# Patient Record
Sex: Male | Born: 1945 | Race: Black or African American | Hispanic: No | Marital: Married | Smoking: Former smoker
Health system: Southern US, Community
[De-identification: ages and names within clinical notes are randomized; demographics above are authoritative.]

## PROBLEM LIST (undated history)

## (undated) DIAGNOSIS — Z87448 Personal history of other diseases of urinary system: Secondary | ICD-10-CM

## (undated) DIAGNOSIS — K219 Gastro-esophageal reflux disease without esophagitis: Secondary | ICD-10-CM

## (undated) DIAGNOSIS — R569 Unspecified convulsions: Secondary | ICD-10-CM

## (undated) DIAGNOSIS — N201 Calculus of ureter: Secondary | ICD-10-CM

## (undated) DIAGNOSIS — S479XXA Crushing injury of shoulder and upper arm, unspecified arm, initial encounter: Secondary | ICD-10-CM

## (undated) DIAGNOSIS — E119 Type 2 diabetes mellitus without complications: Secondary | ICD-10-CM

## (undated) DIAGNOSIS — Z8669 Personal history of other diseases of the nervous system and sense organs: Secondary | ICD-10-CM

## (undated) DIAGNOSIS — T7840XA Allergy, unspecified, initial encounter: Secondary | ICD-10-CM

## (undated) DIAGNOSIS — N135 Crossing vessel and stricture of ureter without hydronephrosis: Secondary | ICD-10-CM

## (undated) DIAGNOSIS — J45909 Unspecified asthma, uncomplicated: Secondary | ICD-10-CM

## (undated) DIAGNOSIS — I10 Essential (primary) hypertension: Secondary | ICD-10-CM

## (undated) DIAGNOSIS — M1990 Unspecified osteoarthritis, unspecified site: Secondary | ICD-10-CM

## (undated) DIAGNOSIS — E785 Hyperlipidemia, unspecified: Secondary | ICD-10-CM

## (undated) DIAGNOSIS — H269 Unspecified cataract: Secondary | ICD-10-CM

## (undated) HISTORY — DX: Crushing injury of shoulder and upper arm, unspecified arm, initial encounter: S47.9XXA

## (undated) HISTORY — DX: Unspecified convulsions: R56.9

## (undated) HISTORY — DX: Unspecified asthma, uncomplicated: J45.909

## (undated) HISTORY — DX: Unspecified osteoarthritis, unspecified site: M19.90

## (undated) HISTORY — DX: Unspecified cataract: H26.9

## (undated) HISTORY — DX: Hyperlipidemia, unspecified: E78.5

## (undated) HISTORY — PX: EYE SURGERY: SHX253

## (undated) HISTORY — DX: Allergy, unspecified, initial encounter: T78.40XA

## (undated) HISTORY — DX: Essential (primary) hypertension: I10

---

## 1999-10-08 ENCOUNTER — Ambulatory Visit (HOSPITAL_COMMUNITY): Admission: RE | Admit: 1999-10-08 | Discharge: 1999-10-08 | Payer: Self-pay | Admitting: *Deleted

## 1999-10-08 ENCOUNTER — Encounter: Payer: Self-pay | Admitting: *Deleted

## 1999-12-17 ENCOUNTER — Ambulatory Visit (HOSPITAL_COMMUNITY): Admission: RE | Admit: 1999-12-17 | Discharge: 1999-12-17 | Payer: Self-pay | Admitting: Gastroenterology

## 2005-12-23 ENCOUNTER — Encounter: Admission: RE | Admit: 2005-12-23 | Discharge: 2005-12-23 | Payer: Self-pay | Admitting: Sports Medicine

## 2007-07-12 ENCOUNTER — Emergency Department (HOSPITAL_COMMUNITY): Admission: EM | Admit: 2007-07-12 | Discharge: 2007-07-13 | Payer: Self-pay | Admitting: Emergency Medicine

## 2010-05-09 HISTORY — PX: TRANSURETHRAL RESECTION OF PROSTATE: SHX73

## 2010-09-24 NOTE — Op Note (Signed)
[place]  Patient:    Jeremiah Green, Jeremiah Green                     MRN: [id number] Proc. Date: 12/17/99 Adm. Date:  [id number] Disc. Date: [id number] Attending:  Nelda Marseille CC:         Javier Docker, M.D.  Lorelle Formosa, M.D.   Operative Report  PROCEDURE:  Colonoscopy with biopsy.  INDICATIONS:  Patient with bright red blood per rectum probably due to hemorrhoids, a history of colon polyps and a family history of colon polyps and possibly even colon cancer in his mom.  Due for repeat screening.  INFORMED CONSENT:  Consent was signed after risk, benefits, methods and options were thoroughly discussed multiple times in the past.  MEDICINES USED:  Demerol 40 mg, Versed 5 mg.  DESCRIPTION OF PROCEDURE:  Rectal inspection is pertinent for external hemorrhoids.  Digital exam is negative.  Video colonoscope was inserted and easily advanced around the colon to the cecum.  This did not require any abdominal pressure or any position changes.  The cecum was identified by the appendiceal orifice and the ileocecal valve.  The scope was slowly withdrawn. The prep was adequate.  There was minimal liquid stool that required washing and suctioning.  On slow withdrawal through the colon a rare left and right-sided diverticula were seen.  At the proximal level of the sigmoid descending junction a tiny 1-2 mm polyp was seen and was cold biopsied x 2. The scope was further withdrawn back to the rectum.  No other abnormalities were seen.  Once back in the rectum the scope was retroflexed pertinent for some internal hemorrhoids.  Anorectal ______ confirmed the above.  The scope was reinserted a short ways up the left side of the colon.  Air was suctioned, scope removed.  The patient tolerated the procedure well.  There was no obvious immediate complication.  ENDOSCOPIC DIAGNOSES: 1. Internal and external hemorrhoids. 2. Occasional diverticula left  and right. 3. Tiny sigmoid descending polyps status post cold biopsy. 4. Otherwise within normal limits to the cecum.  PLAN: 1. Await pathology and probably recheck colon screening in 5 years or    p.r.n.. 2. Follow up in 4-6 weeks to recheck symptoms and probably, if willing, begin    interferon at that time or least make sure everything is in order to begin    soon. 3. I have asked him and I am writing on discharge sheet when he follows up to    make sure JNR hepatitis C treatment nurse is there that day so we can    make sure everything is done prior to beginning. 4. Call me sooner p.r.n. DD:  12/17/99 TD:  12/18/99 Job: [id number]

## 2011-01-31 LAB — DIFFERENTIAL
Basophils Absolute: 0
Basophils Relative: 1
Eosinophils Absolute: 0.2
Eosinophils Relative: 4
Lymphocytes Relative: 31
Lymphs Abs: 1.8
Monocytes Absolute: 0.6
Monocytes Relative: 10
Neutro Abs: 3.2
Neutrophils Relative %: 55

## 2011-01-31 LAB — I-STAT 8, (EC8 V) (CONVERTED LAB)
Acid-base deficit: 2
BUN: 14
Bicarbonate: 22.1
Chloride: 111
Glucose, Bld: 134 — ABNORMAL HIGH
HCT: 48
Hemoglobin: 16.3
Operator id: 295131
Potassium: 4
Sodium: 140
TCO2: 23
pCO2, Ven: 34.5 — ABNORMAL LOW
pH, Ven: 7.415 — ABNORMAL HIGH

## 2011-01-31 LAB — CBC
HCT: 43.5
Hemoglobin: 14.9
MCHC: 34.2
MCV: 95.5
Platelets: 122 — ABNORMAL LOW
RBC: 4.56
RDW: 13.4
WBC: 5.9

## 2011-01-31 LAB — URINALYSIS, ROUTINE W REFLEX MICROSCOPIC
Bilirubin Urine: NEGATIVE
Glucose, UA: 100 — AB
Hgb urine dipstick: NEGATIVE
Ketones, ur: 15 — AB
Nitrite: NEGATIVE
Protein, ur: NEGATIVE
Specific Gravity, Urine: 1.024
Urobilinogen, UA: 0.2
pH: 5.5

## 2011-01-31 LAB — POCT I-STAT CREATININE
Creatinine, Ser: 1.2
Operator id: 295131

## 2011-01-31 LAB — POCT CARDIAC MARKERS
CKMB, poc: <1 — ABNORMAL LOW
Myoglobin, poc: 162
Operator id: 295131
Troponin i, poc: <0.05

## 2011-05-10 HISTORY — PX: GLAUCOMA SURGERY: SHX656

## 2012-11-22 ENCOUNTER — Ambulatory Visit: Payer: Self-pay

## 2012-11-22 ENCOUNTER — Ambulatory Visit (INDEPENDENT_AMBULATORY_CARE_PROVIDER_SITE_OTHER): Payer: Medicare Other | Admitting: Family Medicine

## 2012-11-22 VITALS — BP 126/86 | HR 70 | Temp 98.2°F | Resp 20 | Ht 68.0 in | Wt 172.0 lb

## 2012-11-22 DIAGNOSIS — M549 Dorsalgia, unspecified: Secondary | ICD-10-CM

## 2012-11-22 DIAGNOSIS — R109 Unspecified abdominal pain: Secondary | ICD-10-CM

## 2012-11-22 DIAGNOSIS — N200 Calculus of kidney: Secondary | ICD-10-CM

## 2012-11-22 DIAGNOSIS — E119 Type 2 diabetes mellitus without complications: Secondary | ICD-10-CM

## 2012-11-22 LAB — POCT UA - MICROSCOPIC ONLY
Bacteria, U Microscopic: NEGATIVE
Casts, Ur, LPF, POC: NEGATIVE
Crystals, Ur, HPF, POC: NEGATIVE
Epithelial cells, urine per micros: NEGATIVE
Mucus, UA: NEGATIVE
Sperm: POSITIVE
Yeast, UA: NEGATIVE

## 2012-11-22 LAB — POCT CBC
Granulocyte percent: 49.5 %G (ref 37–80)
HCT, POC: 47.7 % (ref 43.5–53.7)
Hemoglobin: 15.3 g/dL (ref 14.1–18.1)
Lymph, poc: 3 (ref 0.6–3.4)
MCH, POC: 32.3 pg — AB (ref 27–31.2)
MCHC: 32.1 g/dL (ref 31.8–35.4)
MCV: 100.9 fL — AB (ref 80–97)
MID (cbc): 0.8 (ref 0–0.9)
MPV: 11.6 fL (ref 0–99.8)
POC Granulocyte: 3.7 (ref 2–6.9)
POC LYMPH PERCENT: 39.9 %L (ref 10–50)
POC MID %: 10.6 %M (ref 0–12)
Platelet Count, POC: 152 10*3/uL (ref 142–424)
RBC: 4.73 M/uL (ref 4.69–6.13)
RDW, POC: 13.5 %
WBC: 7.5 10*3/uL (ref 4.6–10.2)

## 2012-11-22 LAB — POCT URINALYSIS DIPSTICK
Bilirubin, UA: NEGATIVE
Glucose, UA: 100
Leukocytes, UA: NEGATIVE
Nitrite, UA: NEGATIVE
Protein, UA: 30
Spec Grav, UA: 1.025
Urobilinogen, UA: 0.2
pH, UA: 6

## 2012-11-22 LAB — GLUCOSE, POCT (MANUAL RESULT ENTRY): POC Glucose: 75 mg/dl (ref 70–99)

## 2012-11-22 MED ORDER — OXYCODONE-ACETAMINOPHEN 5-325 MG PO TABS: 1.0000 | ORAL_TABLET | Freq: Three times a day (TID) | ORAL | Status: DC | PRN

## 2012-11-22 MED ORDER — KETOROLAC TROMETHAMINE 30 MG/ML IJ SOLN
30.0000 mg | Freq: Once | INTRAMUSCULAR | Status: AC
  Administered 2012-11-22: 30 mg via INTRAMUSCULAR

## 2012-11-22 MED ORDER — ONDANSETRON HCL 4 MG PO TABS: 4.0000 mg | ORAL_TABLET | Freq: Three times a day (TID) | ORAL | Status: DC | PRN

## 2012-11-22 MED ORDER — IBUPROFEN 600 MG PO TABS: 600.0000 mg | ORAL_TABLET | Freq: Three times a day (TID) | ORAL | Status: DC | PRN

## 2012-11-22 NOTE — Progress Notes (Signed)
[facility]:  Office Visit  Chief Complaint:  Chief Complaint  Patient presents with  . Back Pain    lower back pain in Lt side x 6 days. -NKI-    HPI: Jeremiah Green is a 67 y.o. male who complains of  1 week history of left sided back pain , sharp and worse with movement. Used ibuprofen with relief.  He has had subjective feves, chills. He deneis n/v. He has some back pain that radiates to stomach and the pain has been moving. He has had no problems urinating. He has had kidney stones before x 1.   Past Medical History  Diagnosis Date  . Asthma   . Diabetes mellitus without complication   . Kidney stone    Past Surgical History  Procedure Laterality Date  . Prostate surgery    . Eye surgery     History   Social History  . Marital Status: Married    Spouse Name: N/A    Number of Children: N/A  . Years of Education: N/A   Social History Main Topics  . Smoking status: Former Games developer  . Smokeless tobacco: None  . Alcohol Use: No  . Drug Use: No  . Sexually Active: Yes   Other Topics Concern  . None   Social History Narrative  . None   Family History  Problem Relation Age of Onset  . Cancer Mother   . Heart disease Father    Allergies  Allergen Reactions  . Sulfa Antibiotics Rash   Prior to Admission medications   Medication Sig Start Date End Date Taking? Authorizing Provider  Fluticasone-Salmeterol (ADVAIR) 500-50 MCG/DOSE AEPB Inhale 1 puff into the lungs every 12 (twelve) hours.   Yes Historical Provider, MD  sitaGLIPtan-metformin (JANUMET) 50-500 MG per tablet Take 1 tablet by mouth 2 (two) times daily with a meal.   Yes Historical Provider, MD     ROS: The patient denies fevers, chills, night sweats, unintentional weight loss, chest pain, palpitations, wheezing, dyspnea on exertion, nausea, vomiting, dysuria, hematuria, melena, numbness, weakness, or tingling.   All other systems have been reviewed and were otherwise negative  with the exception of those mentioned in the HPI and as above.    PHYSICAL EXAM: Filed Vitals:   11/22/12 1700  BP: 126/86  Pulse: 70  Temp: 98.2 F (36.8 C)  Resp: 20   Filed Vitals:   11/22/12 1700  Height: 5\' 8"  (1.727 m)  Weight: 172 lb (78.019 kg)   Body mass index is 26.16 kg/(m^2).  General: Alert, no acute distress HEENT:  Normocephalic, atraumatic, oropharynx patent.  Cardiovascular:  Regular rate and rhythm, no rubs murmurs or gallops.  No Carotid bruits, radial pulse intact. No pedal edema.  Respiratory: Clear to auscultation bilaterally.  No wheezes, rales, or rhonchi.  No cyanosis, no use of accessory musculature GI: No organomegaly, abdomen is soft and non-tender, positive bowel sounds.  No masses. Skin: No rashes. Neurologic: Facial musculature symmetric. Psychiatric: Patient is appropriate throughout our interaction. Lymphatic: No cervical lymphadenopathy Musculoskeletal: Gait intact. + CVA tenderness    LABS: Results for orders placed in visit on 11/22/12  POCT CBC      Result Value Range   WBC 7.5  4.6 - 10.2 K/uL   Lymph, poc 3.0  0.6 - 3.4   POC LYMPH PERCENT 39.9  10 - 50 %L   MID (cbc) 0.8  0 - 0.9   POC MID % 10.6  0 -  12 %M   POC Granulocyte 3.7  2 - 6.9   Granulocyte percent 49.5  37 - 80 %G   RBC 4.73  4.69 - 6.13 M/uL   Hemoglobin 15.3  14.1 - 18.1 g/dL   HCT, POC 96.0  45.4 - 53.7 %   MCV 100.9 (*) 80 - 97 fL   MCH, POC 32.3 (*) 27 - 31.2 pg   MCHC 32.1  31.8 - 35.4 g/dL   RDW, POC 09.8     Platelet Count, POC 152  142 - 424 K/uL   MPV 11.6  0 - 99.8 fL  POCT UA - MICROSCOPIC ONLY      Result Value Range   WBC, Ur, HPF, POC 0-5     RBC, urine, microscopic 0-1     Bacteria, U Microscopic neg     Mucus, UA neg     Epithelial cells, urine per micros neg     Crystals, Ur, HPF, POC neg     Casts, Ur, LPF, POC neg     Yeast, UA neg     Sperm positive    POCT URINALYSIS DIPSTICK      Result Value Range   Color, UA yellow      Clarity, UA clear     Glucose, UA 100     Bilirubin, UA neg     Ketones, UA trace     Spec Grav, UA 1.025     Blood, UA trace-lysed     pH, UA 6.0     Protein, UA 30     Urobilinogen, UA 0.2     Nitrite, UA neg     Leukocytes, UA Negative    GLUCOSE, POCT (MANUAL RESULT ENTRY)      Result Value Range   POC Glucose 75  70 - 99 mg/dl     EKG/XRAY:   Primary read interpreted by Dr. Conley Rolls at [facility]. No acute cardiopulmoanry process + left sided 4 mm renal stone No obstruction, no free air L spine shows no fractures/dislocaton   ASSESSMENT/PLAN: Encounter Diagnoses  Name Primary?  . Abdominal  pain, other specified site Yes  . Back pain   . Diabetes   . Renal stone    Got injection of toradol 30 mg IM since I did not know his kidney function at the time Rx Percocet Rx Ibuprofen CMP pending Strainer given. Advise to push fluids F/u prn , call in 2 days    Tawana Pasch PHUONG, DO 11/22/2012 6:36 PM  11/23/2012 At 10:10 AM-  Spoke with patient regarding renal stone in ureter. He will monitor sxs and if no improvement by Sat or Sunday then will consider CT scan pending on his sxs IMPRESSION:  8 mm calcification in the region of the left ureter. This finding  could be further evaluated with CT if clinically indicated.

## 2012-11-22 NOTE — Patient Instructions (Signed)
Ureteral Colic Ureteral colic is spasm-like pain from the kidney or the ureter. This is often caused by a kidney stone. The pain is caused by the stone trying to get through the tubes that pass your pee. HOME CARE   Drink enough fluids to keep your pee (urine) clear or pale yellow.  Strain all your pee. A strainer will be provided. Keep anything caught in the strainer and bring it to your doctor. The stone causing the pain may be very small.  Only take medicine as told by your doctor.  Follow up with your doctor as told. GET HELP RIGHT AWAY IF:   Pain is not controlled with medicine.  Pain continues or gets worse.  The pain changes and there is chest or belly (abdominal) pain.  You pass out (faint).  You cannot pee.  You keep throwing up (vomiting).  You have a temperature by mouth above 102 F (38.9 C), not controlled by medicine. MAKE SURE YOU:   Understand these instructions.  Will watch this condition.  Will get help right away if you are not doing well or get worse. Document Released: 10/12/2007 Document Revised: 07/18/2011 Document Reviewed: 10/12/2007 Exit[employer] Patient Information 2014 [employer].  

## 2012-11-23 ENCOUNTER — Telehealth: Payer: Self-pay | Admitting: Family Medicine

## 2012-11-23 LAB — COMPREHENSIVE METABOLIC PANEL
ALT: 75 U/L — ABNORMAL HIGH (ref 0–53)
AST: 66 U/L — ABNORMAL HIGH (ref 0–37)
Albumin: 4 g/dL (ref 3.5–5.2)
Alkaline Phosphatase: 56 U/L (ref 39–117)
BUN: 23 mg/dL (ref 6–23)
CO2: 27 mEq/L (ref 19–32)
Calcium: 9.8 mg/dL (ref 8.4–10.5)
Chloride: 102 mEq/L (ref 96–112)
Creat: 2.13 mg/dL — ABNORMAL HIGH (ref 0.50–1.35)
Glucose, Bld: 94 mg/dL (ref 70–99)
Potassium: 4.1 mEq/L (ref 3.5–5.3)
Sodium: 139 mEq/L (ref 135–145)
Total Bilirubin: 0.6 mg/dL (ref 0.3–1.2)
Total Protein: 7.7 g/dL (ref 6.0–8.3)

## 2012-11-23 NOTE — Telephone Encounter (Signed)
[facility] called report for L spine. 8 mm calcification in the region of the left ureter. This finding could be further evaluated with CT if clinically indicated. See report in imaging

## 2012-11-24 ENCOUNTER — Telehealth: Payer: Self-pay | Admitting: Family Medicine

## 2012-11-24 ENCOUNTER — Other Ambulatory Visit: Payer: Self-pay | Admitting: Family Medicine

## 2012-11-24 DIAGNOSIS — E119 Type 2 diabetes mellitus without complications: Secondary | ICD-10-CM

## 2012-11-24 MED ORDER — SITAGLIPTIN PHOSPHATE 50 MG PO TABS: 50.0000 mg | ORAL_TABLET | Freq: Two times a day (BID) | ORAL | Status: DC

## 2012-11-24 MED ORDER — GLIMEPIRIDE 2 MG PO TABS: ORAL_TABLET | ORAL | Status: DC

## 2012-11-24 NOTE — Telephone Encounter (Signed)
Spoke to pt about labs and sxs. Will come in tomorrow. Advise to stop ibuporfen, Janumet. Changed his med to just [place] and laso add amaryl since taking away metformin due to elvated creatinine of 2.13. He will come in tomorrow, consider CT scan sicne his painis not improving and uretral stone ? 8 mm.

## 2012-11-24 NOTE — Telephone Encounter (Signed)
LM to call me about his labs, creatinine is 2.13. Left my cell number.

## 2012-11-25 ENCOUNTER — Ambulatory Visit (HOSPITAL_COMMUNITY)
Admission: RE | Admit: 2012-11-25 | Discharge: 2012-11-25 | Disposition: A | Discharge status: Home / Self Care | Payer: 59 | Source: Ambulatory Visit | Attending: Family Medicine | Admitting: Family Medicine

## 2012-11-25 ENCOUNTER — Ambulatory Visit (INDEPENDENT_AMBULATORY_CARE_PROVIDER_SITE_OTHER): Payer: Medicare Other | Admitting: Family Medicine

## 2012-11-25 VITALS — BP 136/90 | HR 73 | Temp 98.0°F | Resp 16 | Ht 69.0 in | Wt 173.0 lb

## 2012-11-25 DIAGNOSIS — R6883 Chills (without fever): Secondary | ICD-10-CM

## 2012-11-25 DIAGNOSIS — N289 Disorder of kidney and ureter, unspecified: Secondary | ICD-10-CM

## 2012-11-25 DIAGNOSIS — M549 Dorsalgia, unspecified: Secondary | ICD-10-CM

## 2012-11-25 DIAGNOSIS — N1330 Unspecified hydronephrosis: Secondary | ICD-10-CM | POA: Insufficient documentation

## 2012-11-25 DIAGNOSIS — M47814 Spondylosis without myelopathy or radiculopathy, thoracic region: Secondary | ICD-10-CM | POA: Insufficient documentation

## 2012-11-25 DIAGNOSIS — N201 Calculus of ureter: Secondary | ICD-10-CM | POA: Insufficient documentation

## 2012-11-25 DIAGNOSIS — N200 Calculus of kidney: Secondary | ICD-10-CM

## 2012-11-25 LAB — POCT CBC
Granulocyte percent: 56 %G (ref 37–80)
HCT, POC: 44.3 % (ref 43.5–53.7)
Hemoglobin: 13.8 g/dL — AB (ref 14.1–18.1)
Lymph, poc: 2.1 (ref 0.6–3.4)
MCH, POC: 31.2 pg (ref 27–31.2)
MCHC: 31.2 g/dL — AB (ref 31.8–35.4)
MCV: 100.3 fL — AB (ref 80–97)
MID (cbc): 0.7 (ref 0–0.9)
MPV: 12 fL (ref 0–99.8)
POC Granulocyte: 3.5 (ref 2–6.9)
POC LYMPH PERCENT: 33.2 %L (ref 10–50)
POC MID %: 10.8 %M (ref 0–12)
Platelet Count, POC: 139 10*3/uL — AB (ref 142–424)
RBC: 4.42 M/uL — AB (ref 4.69–6.13)
RDW, POC: 13.2 %
WBC: 6.2 10*3/uL (ref 4.6–10.2)

## 2012-11-25 LAB — COMPREHENSIVE METABOLIC PANEL
ALT: 51 U/L (ref 0–53)
AST: 39 U/L — ABNORMAL HIGH (ref 0–37)
Albumin: 3.7 g/dL (ref 3.5–5.2)
Alkaline Phosphatase: 52 U/L (ref 39–117)
BUN: 23 mg/dL (ref 6–23)
CO2: 25 mEq/L (ref 19–32)
Calcium: 9.4 mg/dL (ref 8.4–10.5)
Chloride: 100 mEq/L (ref 96–112)
Creat: 2.23 mg/dL — ABNORMAL HIGH (ref 0.50–1.35)
Glucose, Bld: 144 mg/dL — ABNORMAL HIGH (ref 70–99)
Potassium: 4.6 mEq/L (ref 3.5–5.3)
Sodium: 136 mEq/L (ref 135–145)
Total Bilirubin: 0.6 mg/dL (ref 0.3–1.2)
Total Protein: 7 g/dL (ref 6.0–8.3)

## 2012-11-25 MED ORDER — TAMSULOSIN HCL 0.4 MG PO CAPS: 0.4000 mg | ORAL_CAPSULE | Freq: Every day | ORAL | Status: DC

## 2012-11-25 NOTE — Progress Notes (Signed)
[facility]:  Office Visit  Chief Complaint:  Chief Complaint  Patient presents with  . Follow-up  . Nephrolithiasis    HPI: Jeremiah Green is a 67 y.o. male who complains of here for recheck, not feeling better. He has had chills, continues to have similar to maybe slightly more pain and could not sleep.   Still Severe 10/10 pain with movement, with breathing. He stopped taking ibuprofen and metformin after we spoke about his creatinine being elevated. Has been drinking lots of water. He feels like the stone is staying there. He has taken pain meds without relief. Urination is normal. He denies any nausea/vomiting/abdominal pain.   Past Medical History  Diagnosis Date  . Asthma   . Diabetes mellitus without complication   . Kidney stone    Past Surgical History  Procedure Laterality Date  . Prostate surgery    . Eye surgery     History   Social History  . Marital Status: Married    Spouse Name: N/A    Number of Children: N/A  . Years of Education: N/A   Social History Main Topics  . Smoking status: Former Games developer  . Smokeless tobacco: None  . Alcohol Use: No  . Drug Use: No  . Sexually Active: Yes   Other Topics Concern  . None   Social History Narrative  . None   Family History  Problem Relation Age of Onset  . Cancer Mother   . Heart disease Father    Allergies  Allergen Reactions  . Sulfa Antibiotics Rash   Prior to Admission medications   Medication Sig Start Date End Date Taking? Authorizing Provider  Fluticasone-Salmeterol (ADVAIR) 500-50 MCG/DOSE AEPB Inhale 1 puff into the lungs every 12 (twelve) hours.   Yes Historical Provider, MD  oxyCODONE-acetaminophen (ROXICET) 5-325 MG per tablet Take 1 tablet by mouth every 8 (eight) hours as needed for pain. For severe pain. Monitor for constipation. 11/22/12  Yes Jeanifer Halliday P Benedicto Capozzi, DO  sitaGLIPtan-metformin (JANUMET) 50-500 MG per tablet Take 1 tablet by mouth 2 (two) times daily with a meal.    Yes Historical Provider, MD  glimepiride (AMARYL) 2 MG tablet Take 1 tab PO twice a day with food. Monitor for low sugars. If you have dizziness or weakness or  rash please stop using this 11/24/12   Amarachi Kotz P Sinahi Knights, DO  ondansetron (ZOFRAN) 4 MG tablet Take 1 tablet (4 mg total) by mouth every 8 (eight) hours as needed for nausea. 11/22/12   Burnadette Baskett P Areliz Rothman, DO  sitaGLIPtin (JANUVIA) 50 MG tablet Take 1 tablet (50 mg total) by mouth 2 (two) times daily with a meal. 11/24/12   Aleasha Fregeau P Jordon Kristiansen Faulcon, DO     ROS: The patient denies fevers, night sweats, unintentional weight loss, chest pain, palpitations, wheezing, dyspnea on exertion, nausea, vomiting, abdominal pain, dysuria, hematuria, melena, numbness,  or tingling.   All other systems have been reviewed and were otherwise negative with the exception of those mentioned in the HPI and as above.    PHYSICAL EXAM: Filed Vitals:   11/25/12 0859  BP: 136/90  Pulse: 73  Temp: 98 F (36.7 C)  Resp: 16   Filed Vitals:   11/25/12 0859  Height: 5\' 9"  (1.753 m)  Weight: 173 lb (78.472 kg)   Body mass index is 25.54 kg/(m^2).  General: Alert, no acute distress HEENT:  Normocephalic, atraumatic, oropharynx patent.  Cardiovascular:  Regular rate and rhythm, no rubs murmurs or gallops.  No Carotid bruits, radial pulse intact. No pedal edema.  Respiratory: Clear to auscultation bilaterally.  No wheezes, rales, or rhonchi.  No cyanosis, no use of accessory musculature GI: No organomegaly, abdomen is soft and minimally-tender, positive bowel sounds.  No masses. Skin: No rashes. Neurologic: Facial musculature symmetric. Psychiatric: Patient is appropriate throughout our interaction. Lymphatic: No cervical lymphadenopathy Musculoskeletal: Gait intact. + left flank pain, CVA tenderness   LABS: Results for orders placed in visit on 11/25/12  POCT CBC      Result Value Range   WBC 6.2  4.6 - 10.2 K/uL   Lymph, poc 2.1  0.6 - 3.4   POC LYMPH PERCENT 33.2  10 - 50 %L    MID (cbc) 0.7  0 - 0.9   POC MID % 10.8  0 - 12 %M   POC Granulocyte 3.5  2 - 6.9   Granulocyte percent 56.0  37 - 80 %G   RBC 4.42 (*) 4.69 - 6.13 M/uL   Hemoglobin 13.8 (*) 14.1 - 18.1 g/dL   HCT, POC 09.8  11.9 - 53.7 %   MCV 100.3 (*) 80 - 97 fL   MCH, POC 31.2  27 - 31.2 pg   MCHC 31.2 (*) 31.8 - 35.4 g/dL   RDW, POC 14.7     Platelet Count, POC 139 (*) 142 - 424 K/uL   MPV 12.0  0 - 99.8 fL     EKG/XRAY:   Primary read interpreted by Dr. Conley Rolls at [facility].   ASSESSMENT/PLAN: Encounter Diagnoses  Name Primary?  . Renal stone Yes  . Renal insufficiency   . Back pain   . Chills     Get CT scan of abd/pelvis today at [facility] Renal stone not moving, he has similar pain, unchanged Rx flomax Refer to urology C/w water, stop ibuprofen, stop metformin,  Take oxcydone prn pain He will take new rx of Januvia, Amaryl for DM until his ARF improves, I believe this is all ureter stone related F/u by phone with results     Janiene Aarons PHUONG, DO 11/25/2012 10:02 AM    CT results d/w patient

## 2012-12-05 ENCOUNTER — Other Ambulatory Visit: Payer: Self-pay | Admitting: Urology

## 2012-12-06 ENCOUNTER — Encounter (HOSPITAL_COMMUNITY): Payer: Self-pay | Admitting: Pharmacy Technician

## 2012-12-07 NOTE — Progress Notes (Signed)
Spoke to patient via phone,history obtained,updated.  Bring blue folder,insurance cards,picture ID,designated driver and living will,POA, if desires (to be placed on chart). Reinforced no aspirin(instructions to hold aspirin per your doctor), ibuprofen/advil Toradol products 72 hours prior to procedure. No vitamins or herbal medicines 7 days prior to procedure.   Follow laxative instructions provided by urologist (office) and in blue folder. Wear easy on/off clothing and no jewelry except wedding rings and ear rings. Leave all other valuables at home. Verbalizes understanding of instructionsr

## 2012-12-10 ENCOUNTER — Encounter (HOSPITAL_COMMUNITY): Payer: Self-pay | Admitting: *Deleted

## 2012-12-10 ENCOUNTER — Ambulatory Visit (HOSPITAL_COMMUNITY): Payer: 59

## 2012-12-10 ENCOUNTER — Ambulatory Visit (HOSPITAL_COMMUNITY)
Admission: RE | Admit: 2012-12-10 | Discharge: 2012-12-10 | Disposition: A | Discharge status: Home / Self Care | Payer: 59 | Source: Ambulatory Visit | Attending: Urology | Admitting: Urology

## 2012-12-10 ENCOUNTER — Encounter (HOSPITAL_COMMUNITY)
Admission: RE | Disposition: A | Discharge status: Home / Self Care | Payer: Self-pay | Source: Ambulatory Visit | Attending: Urology

## 2012-12-10 DIAGNOSIS — E119 Type 2 diabetes mellitus without complications: Secondary | ICD-10-CM | POA: Insufficient documentation

## 2012-12-10 DIAGNOSIS — N201 Calculus of ureter: Secondary | ICD-10-CM | POA: Insufficient documentation

## 2012-12-10 DIAGNOSIS — Z79899 Other long term (current) drug therapy: Secondary | ICD-10-CM | POA: Insufficient documentation

## 2012-12-10 DIAGNOSIS — N400 Enlarged prostate without lower urinary tract symptoms: Secondary | ICD-10-CM | POA: Insufficient documentation

## 2012-12-10 DIAGNOSIS — J45909 Unspecified asthma, uncomplicated: Secondary | ICD-10-CM | POA: Insufficient documentation

## 2012-12-10 DIAGNOSIS — I4949 Other premature depolarization: Secondary | ICD-10-CM | POA: Insufficient documentation

## 2012-12-10 DIAGNOSIS — N1330 Unspecified hydronephrosis: Secondary | ICD-10-CM | POA: Insufficient documentation

## 2012-12-10 HISTORY — PX: EXTRACORPOREAL SHOCK WAVE LITHOTRIPSY: SHX1557

## 2012-12-10 LAB — GLUCOSE, CAPILLARY
Glucose-Capillary: 95 mg/dL (ref 70–99)
Glucose-Capillary: 128 mg/dL — ABNORMAL HIGH (ref 70–99)

## 2012-12-10 SURGERY — LITHOTRIPSY, ESWL
Anesthesia: LOCAL | Laterality: Left

## 2012-12-10 MED ORDER — DIPHENHYDRAMINE HCL 25 MG PO CAPS
25.0000 mg | ORAL_CAPSULE | ORAL | Status: AC
  Administered 2012-12-10: 25 mg via ORAL
  Filled 2012-12-10: qty 1

## 2012-12-10 MED ORDER — CIPROFLOXACIN HCL 500 MG PO TABS
500.0000 mg | ORAL_TABLET | ORAL | Status: AC
  Administered 2012-12-10: 500 mg via ORAL
  Filled 2012-12-10: qty 1

## 2012-12-10 MED ORDER — DIAZEPAM 5 MG PO TABS
10.0000 mg | ORAL_TABLET | ORAL | Status: AC
  Administered 2012-12-10: 10 mg via ORAL
  Filled 2012-12-10: qty 2

## 2012-12-10 MED ORDER — DEXTROSE-NACL 5-0.45 % IV SOLN
INTRAVENOUS | Status: DC
  Administered 2012-12-10: 14:00:00 via INTRAVENOUS

## 2012-12-11 NOTE — H&P (Signed)
Chief Complaint  Left ureter stone   Active Problems   BPH: TURP- Dr. Lindley Magnus at [facility]   History of Present Illness     67 yo M referred by Dr. Rockne Coons for nephrolithiasis. More accurately this would be a ureter stone. This is associated with left-sided flank pain. He was initially discovered on an abdominal x-ray. This was confirmed with CT scan 11/25/12. The stone is in the left ureter. It is 8 mm in size. It was associated with mild left hydronephrosis. UA today is negative for signs of infection. He states he normally [place] with [place]. Daine [place]rologic care, but he would like to go ahead and schedule treatment for his stone. He is currently taking Flomax and regular basis. He states that his stone pain began 2 weeks ago. It is not associated with nausea, emesis, or fever. There is some concern that he may have some renal insufficiency due to the stone. The patient does not know whether this is chronic or a new issue. Renal results are listed below. He wished to proceed with KUB today to evaluate the size and location of the stone again. Please refer to resolve section for interpretation. This reveals the stone is relatively unchanged in position. It is clearly visible on KUB.   Stat renal function today revealed his creatinine to be 1.6 which is much improved over his previous values. Because of this I do not think it is necessary to place a stent before treatment. He would also likely be a better candidate for shockwave lithotripsy because of this.  Today we discussed the management of urinary stones. These options include observation, ureteroscopy, shockwave lithotripsy, and PCNL. We discussed which options are relevant to these particular stones. We discussed the natural history of stones as well as the complications of untreated stones and the impact on quality of life without treatment as well as with each of the above listed treatments. We also discussed the efficacy  of each treatment in its ability to clear the stone burden. With any of these management options I discussed the signs and symptoms of infection and the need for emergent treatment should these be experienced. For each option we discussed the ability of each procedure to clear the patient of their stone burden. We also discussed risks, benefits, alternatives, and likelihood of achieving goals.  For observation I described the risks which include but are not limited to silent renal damage, life-threatening infection, need for emergent surgery, failure to pass stone, and pain.  For ureteroscopy I described the risks which include heart attack, stroke, pulmonary embolus, death, bleeding, infection, damage to contiguous structures, positioning injury, ureteral stricture, ureteral avulsion, ureteral injury, need for ureteral stent, inability to perform ureteroscopy, need for an interval procedure, inability to clear stone burden, stent discomfort and pain.  For shockwave lithotripsy I described the risks which include arrhythmia, kidney contusion, kidney hemorrhage, need for transfusion, long-term risk of diabetes or hypertension, back discomfort, flank ecchymosis, flank abrasion, inability to break up stone, inability to pass stone fragments, Steinstrasse, infection associated with obstructing stones, need for different surgical procedure, need for repeat shockwave lithotripsy, and death.    12/05/2012: Cr 2.13 UA: RBC 0-1, WBC 0-5, negative LE/nitrite.  11/25/12: Cr 2.23  12-05-2012: Abdominal X-ray showed calcification which could be in left ureter; 8mm lateral to L4.  11/25/12: CT abd/pelvis w/o contrast: No nephrolithiasis. Left ureter stone. It is 8 mm in size. Mild left hydro.  Current Meds 1. Glimepiride 2 MG Oral Tablet; Therapy: (Recorded:28Jul2014) to 2. Januvia 50 MG Oral Tablet; Therapy: (Recorded:28Jul2014) to 3. Tamsulosin HCl 0.4 MG Oral Capsule; Therapy: (Recorded:28Jul2014)  to  Review of Systems Constitutional, skin, eye, otolaryngeal, hematologic/lymphatic, cardiovascular, pulmonary, endocrine, musculoskeletal, gastrointestinal, neurological and psychiatric system(s) were reviewed and pertinent findings if present are noted.  Gastrointestinal: heartburn.  Constitutional: fever, night sweats and feeling tired (fatigue).  ENT: sinus problems.  Respiratory: shortness of breath and cough.  Musculoskeletal: back pain and joint pain.  Neurological: headache.    Vitals Vital Signs [Data Includes: Last 1 Day]  28Jul2014 08:29AM  BMI Calculated: 25.62 BSA Calculated: 1.94 Height: 5 ft 9 in Weight: 173 lb  Blood Pressure: 157 / 95 Temperature: 98.5 F Heart Rate: 66  Physical Exam Constitutional: Well nourished and well developed.  ENT:. The ears and nose are normal in appearance. The oropharynx is normal.  Neck: The appearance of the neck is normal . There is no jugular-venous distention.  Pulmonary: No respiratory distress and normal respiratory rhythm and effort.  Cardiovascular:. The arterial pulses are normal. No peripheral edema. Abdomen: The abdomen is flat and not distended. The abdomen is soft and nontender. The abdomen is no rebound. No CVA tenderness. No hepatosplenomegaly noted. not tender  Lymphatics: The posterior cervical and supraclavicular nodes are not enlarged or tender.  Skin: Normal skin turgor and no visible rash.  Neuro/Psych:. Mood and affect are appropriate. No focal sensory deficits.    Results/Data Urine [Data Includes: Last 1 Day]   28Jul2014  COLOR YELLOW   APPEARANCE CLEAR   SPECIFIC GRAVITY <1.005   pH 6.0   GLUCOSE NEG mg/dL  BILIRUBIN NEG   KETONE NEG mg/dL  BLOOD NEG   PROTEIN NEG mg/dL  UROBILINOGEN 0.2 mg/dL  NITRITE NEG   LEUKOCYTE ESTERASE NEG    Old records or history reviewed: 11/22/12: Cr 2.13 UA: RBC 0-1, WBC 0-5, negative LE/nitrite.  11/25/12: Cr 2.23  11/22/12: Abdominal X-ray showed calcification  which could be in left ureter; 8mm lateral to L4.  11/25/12: CT abd/pelvis w/o contrast: No nephrolithiasis. Left ureter stone. It is 8 mm in size. Mild left hydro.  The following images/tracing/specimen were independently visualized:  KUB was obtained today. This was a single shot AP view as well as a single shot coned down version. This was compared to CT scan and to KUB from 11/22/12. There is a normal bowel gas pattern. There are no blastic lesions of the bones. The previously seen left ureter stone is clearly visible which measures proximally 7-8 mm in size. It is lateral and below the L4 transverse process. There are no visible consultations overlying the kidneys bilaterally or the right ureter.    Assessment Assessed  1. Ureteral Stone Left 592.1  Plan Health Maintenance (V70.0)  1. UA With REFLEX  Done: 28Jul2014 08:23AM Ureteral Stone (592.1)  2. BUN & CREATININE  Done: 28Jul2014 08:50AM 3. KUB  Done: 28Jul2014 12:00AM  Discussion/Summary     Stat renal function today to check for stability. This shows that his kidney function is actually improved.  The patient like to be scheduled for left-sided shockwave lithotripsy. He was given warnings and instructions.  He was also given when someone to report to the ER.  Please send a copy of this note to the patient's referring provider, Dr. Conley Rolls, and his primary care physician.     After a thorough review of the management options for the patient's condition the patient  elected to  proceed with surgical therapy as noted above. We have discussed the potential benefits and risks of the procedure, side effects of the proposed treatment, the likelihood of the patient achieving the goals of the procedure, and any potential problems that might occur during the procedure or recuperation. Informed consent has been obtained.

## 2013-01-11 ENCOUNTER — Other Ambulatory Visit: Payer: Self-pay | Admitting: Urology

## 2013-01-14 ENCOUNTER — Encounter (HOSPITAL_BASED_OUTPATIENT_CLINIC_OR_DEPARTMENT_OTHER): Payer: Self-pay | Admitting: *Deleted

## 2013-01-14 NOTE — Progress Notes (Signed)
NPO AFTER MN WITH EXCEPTION CLEAR LIQUIDS UNTIL 0900 (NO CREAM/ MILK PRODUCTS). ARRIVES AT 1330. NEEDS ISTAT AND EKG. WILL TAKE NEXIUM AND IF NEEDED OXYCODONE/ ZOFRAN AM OF SURG W/ SIPS OF WATER.

## 2013-01-14 NOTE — Progress Notes (Signed)
01/14/13 1522  OBSTRUCTIVE SLEEP APNEA  Have you ever been diagnosed with sleep apnea through a sleep study? No  Do you snore loudly (loud enough to be heard through closed doors)?  1  Do you often feel tired, fatigued, or sleepy during the daytime? 1  Has anyone observed you stop breathing during your sleep? 1  Do you have, or are you being treated for high blood pressure? 0  BMI more than 35 kg/m2? 0  Age over 68 years old? 1  Neck circumference greater than 40 cm/18 inches? 0  Gender: 1  Obstructive Sleep Apnea Score 5  Score 4 or greater  Results sent to PCP

## 2013-01-21 ENCOUNTER — Encounter (HOSPITAL_BASED_OUTPATIENT_CLINIC_OR_DEPARTMENT_OTHER): Payer: Self-pay | Admitting: Anesthesiology

## 2013-01-21 ENCOUNTER — Ambulatory Visit (HOSPITAL_BASED_OUTPATIENT_CLINIC_OR_DEPARTMENT_OTHER)
Admission: RE | Admit: 2013-01-21 | Discharge: 2013-01-21 | Disposition: A | Discharge status: Home / Self Care | Payer: 59 | Source: Ambulatory Visit | Attending: Urology | Admitting: Urology

## 2013-01-21 ENCOUNTER — Ambulatory Visit (HOSPITAL_COMMUNITY): Payer: 59

## 2013-01-21 ENCOUNTER — Encounter (HOSPITAL_BASED_OUTPATIENT_CLINIC_OR_DEPARTMENT_OTHER)
Admission: RE | Disposition: A | Discharge status: Home / Self Care | Payer: Self-pay | Source: Ambulatory Visit | Attending: Urology

## 2013-01-21 ENCOUNTER — Ambulatory Visit (HOSPITAL_BASED_OUTPATIENT_CLINIC_OR_DEPARTMENT_OTHER): Payer: 59 | Admitting: Anesthesiology

## 2013-01-21 DIAGNOSIS — Z01818 Encounter for other preprocedural examination: Secondary | ICD-10-CM | POA: Insufficient documentation

## 2013-01-21 DIAGNOSIS — N135 Crossing vessel and stricture of ureter without hydronephrosis: Secondary | ICD-10-CM | POA: Insufficient documentation

## 2013-01-21 DIAGNOSIS — Z01810 Encounter for preprocedural cardiovascular examination: Secondary | ICD-10-CM | POA: Insufficient documentation

## 2013-01-21 DIAGNOSIS — J45909 Unspecified asthma, uncomplicated: Secondary | ICD-10-CM | POA: Insufficient documentation

## 2013-01-21 DIAGNOSIS — N201 Calculus of ureter: Secondary | ICD-10-CM | POA: Insufficient documentation

## 2013-01-21 DIAGNOSIS — Z87891 Personal history of nicotine dependence: Secondary | ICD-10-CM | POA: Insufficient documentation

## 2013-01-21 DIAGNOSIS — E119 Type 2 diabetes mellitus without complications: Secondary | ICD-10-CM | POA: Insufficient documentation

## 2013-01-21 DIAGNOSIS — N400 Enlarged prostate without lower urinary tract symptoms: Secondary | ICD-10-CM | POA: Insufficient documentation

## 2013-01-21 DIAGNOSIS — K219 Gastro-esophageal reflux disease without esophagitis: Secondary | ICD-10-CM | POA: Insufficient documentation

## 2013-01-21 HISTORY — DX: Calculus of ureter: N20.1

## 2013-01-21 HISTORY — DX: Personal history of other diseases of urinary system: Z87.448

## 2013-01-21 HISTORY — PX: CYSTOSCOPY WITH RETROGRADE PYELOGRAM, URETEROSCOPY AND STENT PLACEMENT: SHX5789

## 2013-01-21 HISTORY — DX: Gastro-esophageal reflux disease without esophagitis: K21.9

## 2013-01-21 HISTORY — DX: Type 2 diabetes mellitus without complications: E11.9

## 2013-01-21 HISTORY — DX: Personal history of other diseases of the nervous system and sense organs: Z86.69

## 2013-01-21 LAB — POCT I-STAT, CHEM 8
BUN: 10 mg/dL (ref 6–23)
Calcium, Ion: 1.22 mmol/L (ref 1.13–1.30)
Chloride: 107 mEq/L (ref 96–112)
Creatinine, Ser: 1.3 mg/dL (ref 0.50–1.35)
Glucose, Bld: 111 mg/dL — ABNORMAL HIGH (ref 70–99)
HCT: 43 % (ref 39.0–52.0)
Hemoglobin: 14.6 g/dL (ref 13.0–17.0)
Potassium: 3.7 mEq/L (ref 3.5–5.1)
Sodium: 143 mEq/L (ref 135–145)
TCO2: 24 mmol/L (ref 0–100)

## 2013-01-21 SURGERY — CYSTOURETEROSCOPY, WITH RETROGRADE PYELOGRAM AND STENT INSERTION
Anesthesia: General | Site: Ureter | Laterality: Left | Wound class: Clean Contaminated

## 2013-01-21 MED ORDER — ONDANSETRON HCL 4 MG/2ML IJ SOLN
INTRAMUSCULAR | Status: DC | PRN
  Administered 2013-01-21: 4 mg via INTRAVENOUS

## 2013-01-21 MED ORDER — DEXAMETHASONE SODIUM PHOSPHATE 4 MG/ML IJ SOLN
INTRAMUSCULAR | Status: DC | PRN
  Administered 2013-01-21: 10 mg via INTRAVENOUS

## 2013-01-21 MED ORDER — GLYCOPYRROLATE 0.2 MG/ML IJ SOLN
INTRAMUSCULAR | Status: DC | PRN
  Administered 2013-01-21: 0.2 mg via INTRAVENOUS

## 2013-01-21 MED ORDER — OXYCODONE HCL 5 MG PO TABS
5.0000 mg | ORAL_TABLET | Freq: Once | ORAL | Status: AC | PRN
  Administered 2013-01-21: 5 mg via ORAL
  Filled 2013-01-21: qty 1

## 2013-01-21 MED ORDER — EPHEDRINE SULFATE 50 MG/ML IJ SOLN
INTRAMUSCULAR | Status: DC | PRN
  Administered 2013-01-21: 10 mg via INTRAVENOUS

## 2013-01-21 MED ORDER — OXYCODONE-ACETAMINOPHEN 5-325 MG PO TABS: 1.0000 | ORAL_TABLET | ORAL | Status: DC | PRN

## 2013-01-21 MED ORDER — PROMETHAZINE HCL 25 MG/ML IJ SOLN
6.2500 mg | INTRAMUSCULAR | Status: DC | PRN
  Filled 2013-01-21: qty 1

## 2013-01-21 MED ORDER — CEPHALEXIN 500 MG PO CAPS: 500.0000 mg | ORAL_CAPSULE | Freq: Three times a day (TID) | ORAL | Status: DC

## 2013-01-21 MED ORDER — LIDOCAINE HCL 2 % EX GEL
CUTANEOUS | Status: DC | PRN
  Administered 2013-01-21: 1

## 2013-01-21 MED ORDER — OXYBUTYNIN CHLORIDE 5 MG PO TABS
5.0000 mg | ORAL_TABLET | Freq: Three times a day (TID) | ORAL | Status: DC
  Administered 2013-01-21: 5 mg via ORAL
  Filled 2013-01-21: qty 1

## 2013-01-21 MED ORDER — HYOSCYAMINE SULFATE 0.125 MG PO TABS: 0.1250 mg | ORAL_TABLET | ORAL | Status: DC | PRN

## 2013-01-21 MED ORDER — CEFAZOLIN SODIUM-DEXTROSE 2-3 GM-% IV SOLR
2.0000 g | INTRAVENOUS | Status: AC
  Administered 2013-01-21: 2 g via INTRAVENOUS
  Filled 2013-01-21: qty 50

## 2013-01-21 MED ORDER — MEPERIDINE HCL 25 MG/ML IJ SOLN
6.2500 mg | INTRAMUSCULAR | Status: DC | PRN
  Filled 2013-01-21: qty 1

## 2013-01-21 MED ORDER — PHENAZOPYRIDINE HCL 100 MG PO TABS: 100.0000 mg | ORAL_TABLET | Freq: Three times a day (TID) | ORAL | Status: DC | PRN

## 2013-01-21 MED ORDER — OXYBUTYNIN CHLORIDE 5 MG PO TABS: 5.0000 mg | ORAL_TABLET | Freq: Four times a day (QID) | ORAL | Status: DC | PRN

## 2013-01-21 MED ORDER — PROPOFOL 10 MG/ML IV BOLUS
INTRAVENOUS | Status: DC | PRN
  Administered 2013-01-21: 150 mg via INTRAVENOUS

## 2013-01-21 MED ORDER — LACTATED RINGERS IV SOLN
INTRAVENOUS | Status: DC
  Administered 2013-01-21 (×2): via INTRAVENOUS
  Filled 2013-01-21: qty 1000

## 2013-01-21 MED ORDER — PHENAZOPYRIDINE HCL 100 MG PO TABS
100.0000 mg | ORAL_TABLET | Freq: Three times a day (TID) | ORAL | Status: DC
  Administered 2013-01-21: 100 mg via ORAL
  Filled 2013-01-21: qty 1

## 2013-01-21 MED ORDER — FENTANYL CITRATE 0.05 MG/ML IJ SOLN
INTRAMUSCULAR | Status: DC | PRN
  Administered 2013-01-21 (×2): 25 ug via INTRAVENOUS
  Administered 2013-01-21: 50 ug via INTRAVENOUS

## 2013-01-21 MED ORDER — OXYCODONE HCL 5 MG/5ML PO SOLN
5.0000 mg | Freq: Once | ORAL | Status: AC | PRN
  Filled 2013-01-21: qty 5

## 2013-01-21 MED ORDER — HYDROMORPHONE HCL PF 1 MG/ML IJ SOLN
0.2500 mg | INTRAMUSCULAR | Status: DC | PRN
  Administered 2013-01-21: 0.5 mg via INTRAVENOUS
  Filled 2013-01-21: qty 1

## 2013-01-21 MED ORDER — SODIUM CHLORIDE 0.9 % IR SOLN
Status: DC | PRN
  Administered 2013-01-21: 6000 mL

## 2013-01-21 MED ORDER — LIDOCAINE HCL (CARDIAC) 20 MG/ML IV SOLN
INTRAVENOUS | Status: DC | PRN
  Administered 2013-01-21: 80 mg via INTRAVENOUS

## 2013-01-21 MED ORDER — SENNOSIDES-DOCUSATE SODIUM 8.6-50 MG PO TABS: 1.0000 | ORAL_TABLET | Freq: Two times a day (BID) | ORAL | Status: DC

## 2013-01-21 MED ORDER — BELLADONNA ALKALOIDS-OPIUM 16.2-60 MG RE SUPP
RECTAL | Status: DC | PRN
  Administered 2013-01-21: 1 via RECTAL

## 2013-01-21 SURGICAL SUPPLY — 37 items
ADAPTER CATH URET PLST 4-6FR (CATHETERS) IMPLANT
BAG DRAIN URO-CYSTO SKYTR STRL (DRAIN) ×3 IMPLANT
BASKET LASER NITINOL 1.9FR (BASKET) IMPLANT
BASKET STNLS GEMINI 4WIRE 3FR (BASKET) IMPLANT
BASKET ZERO TIP NITINOL 2.4FR (BASKET) IMPLANT
BRUSH URET BIOPSY 3F (UROLOGICAL SUPPLIES) IMPLANT
CANISTER SUCT LVC 12 LTR MEDI- (MISCELLANEOUS) ×3 IMPLANT
CATH CLEAR GEL 3F BACKSTOP (CATHETERS) IMPLANT
CATH INTERMIT  6FR 70CM (CATHETERS) IMPLANT
CATH URET 5FR 28IN CONE TIP (BALLOONS)
CATH URET 5FR 28IN OPEN ENDED (CATHETERS) ×3 IMPLANT
CATH URET 5FR 70CM CONE TIP (BALLOONS) IMPLANT
CATH URET DUAL LUMEN 6-10FR 50 (CATHETERS) IMPLANT
CLOTH BEACON ORANGE TIMEOUT ST (SAFETY) ×3 IMPLANT
DRAPE CAMERA CLOSED 9X96 (DRAPES) ×3 IMPLANT
ELECT REM PT RETURN 9FT ADLT (ELECTROSURGICAL)
ELECTRODE REM PT RTRN 9FT ADLT (ELECTROSURGICAL) IMPLANT
GLOVE BIO SURGEON STRL SZ7 (GLOVE) ×3 IMPLANT
GLOVE ECLIPSE 7.0 STRL STRAW (GLOVE) ×3 IMPLANT
GLOVE INDICATOR 7.5 STRL GRN (GLOVE) ×3 IMPLANT
GOWN PREVENTION PLUS LG XLONG (DISPOSABLE) ×3 IMPLANT
GUIDEWIRE 0.038 PTFE COATED (WIRE) IMPLANT
GUIDEWIRE ANG ZIPWIRE 038X150 (WIRE) IMPLANT
GUIDEWIRE STR DUAL SENSOR (WIRE) ×6 IMPLANT
IV NS IRRIG 3000ML ARTHROMATIC (IV SOLUTION) ×6 IMPLANT
KIT BALLIN UROMAX 15FX10 (LABEL) IMPLANT
KIT BALLN UROMAX 15FX4 (MISCELLANEOUS) IMPLANT
KIT BALLN UROMAX 26 75X4 (MISCELLANEOUS)
LASER FIBER DISP (UROLOGICAL SUPPLIES) IMPLANT
PACK CYSTOSCOPY (CUSTOM PROCEDURE TRAY) ×3 IMPLANT
SET HIGH PRES BAL DIL (LABEL)
SHEATH ACCESS URETERAL 38CM (SHEATH) ×3 IMPLANT
SHEATH ACCESS URETERAL 54CM (SHEATH) IMPLANT
SHEATH URET ACCESS 12FR/35CM (UROLOGICAL SUPPLIES) IMPLANT
SHEATH URET ACCESS 12FR/55CM (UROLOGICAL SUPPLIES) IMPLANT
STENT 6X24 (STENTS) ×3 IMPLANT
SYRINGE IRR TOOMEY STRL 70CC (SYRINGE) IMPLANT

## 2013-01-21 NOTE — H&P (Signed)
Urology History and Physical Exam  CC: Left ureter stone.  HPI:  67 year old male presents today for a left ureter stone.  This was discovered on CT scan in July 2014.  It is located at the level of L4.  It is 8 mm in size.  He underwent shockwave lithotripsy on 12/03/12 with my colleague, Dr. Sherron Monday.  It was noted that the patient had a very difficult time staying still during the procedure.  He returned for follow-up on 01/11/13.  KUB that day revealed the stone remains in position and the left ureter at the level of L4.  He denies flank pain or nausea.  Nothing seems to make it better or worse.  We discussed management options including repeat shockwave lithotripsy, observation, and ureteroscopy.  We discussed the risks, benefits, and alternatives as well as side effects of each of these options.  After discussion he elected to proceed with cystoscopy, left ureteroscopy, laser lithotripsy, possible left ureter stent placement, and possible left retrograde pyelogram.  He presents today for that procedure.  UA 01/11/13 was negative for signs of infection.  PMH: Past Medical History  Diagnosis Date  . Asthma   . Type 2 diabetes mellitus   . Left ureteral calculus   . GERD (gastroesophageal reflux disease)   . History of bladder stone   . History of glaucoma     BILATERAL --   S/P LASER REPAIR 2013    PSH: Past Surgical History  Procedure Laterality Date  . Extracorporeal shock wave lithotripsy Left 12-10-2012  . Transurethral resection of prostate  2012    AND BLADDER STONE EXTRACTION  . Glaucoma surgery Bilateral 2013    LASER    Allergies: Allergies  Allergen Reactions  . Sulfa Antibiotics Rash    Medications: No prescriptions prior to admission     Social History: History   Social History  . Marital Status: Married    Spouse Name: N/A    Number of Children: N/A  . Years of Education: N/A   Occupational History  . Not on file.   Social History Main Topics  .  Smoking status: Former Games developer  . Smokeless tobacco: Not on file  . Alcohol Use: No  . Drug Use: No  . Sexual Activity: Yes   Other Topics Concern  . Not on file   Social History Narrative  . No narrative on file    Family History: Family History  Problem Relation Age of Onset  . Cancer Mother   . Heart disease Father     Review of Systems: Positive: None Negative: Fever, chills, SOB, or chest pain.  A further 10 point review of systems was negative except what is listed in the HPI.  Physical Exam: Filed Vitals:   01/21/13 1247  BP: 179/86  Pulse: 60  Temp: 97.3 F (36.3 C)  Resp: 16    General: No acute distress.  Awake. Head:  Normocephalic.  Atraumatic. ENT:  EOMI.  Mucous membranes moist Neck:  Supple.  No lymphadenopathy. CV:  S1 present. S2 present. Regular rate. Pulmonary: Equal effort bilaterally.  Clear to auscultation bilaterally. Abdomen: Soft.  Non- tender to palpation. Skin:  Normal turgor.  No visible rash. Extremity: No gross deformity of bilateral upper extremities.  No gross deformity of    bilateral lower extremities. Neurologic: Alert. Appropriate mood.    Studies:  No results found for this basename: HGB, WBC, PLT,  in the last 72 hours  No results found for this basename: NA,  K, CL, CO2, BUN, CREATININE, CALCIUM, MAGNESIUM, GFRNONAA, GFRAA,  in the last 72 hours   No results found for this basename: PT, INR, APTT,  in the last 72 hours   No components found with this basename: ABG,     Assessment:  Left ureter stone.  Plan: To OR for cystoscopy, left ureteroscopy, laser lithotripsy, possible left ureter stent placement, and possible left retrograde pyelogram.

## 2013-01-21 NOTE — Anesthesia Preprocedure Evaluation (Addendum)
Anesthesia Evaluation  Patient identified by MRN, date of birth, ID band Patient awake    Reviewed: Allergy & Precautions, H&P , NPO status , Patient's Chart, lab work & pertinent test results  Airway Mallampati: I TM Distance: >3 FB Neck ROM: Full    Dental  (+) Dental Advisory Given, Edentulous Upper and Edentulous Lower   Pulmonary asthma , former smoker,  breath sounds clear to auscultation        Cardiovascular negative cardio ROS  Rhythm:Regular Rate:Normal     Neuro/Psych negative neurological ROS  negative psych ROS   GI/Hepatic Neg liver ROS, GERD-  Medicated,  Endo/Other  diabetes, Type 2, Oral Hypoglycemic Agents  Renal/GU negative Renal ROS     Musculoskeletal negative musculoskeletal ROS (+)   Abdominal   Peds  Hematology negative hematology ROS (+)   Anesthesia Other Findings   Reproductive/Obstetrics                          Anesthesia Physical Anesthesia Plan  ASA: II  Anesthesia Plan: General   Post-op Pain Management:    Induction: Intravenous  Airway Management Planned: LMA and Oral ETT  Additional Equipment:   Intra-op Plan:   Post-operative Plan: Extubation in OR  Informed Consent: I have reviewed the patients History and Physical, chart, labs and discussed the procedure including the risks, benefits and alternatives for the proposed anesthesia with the patient or authorized representative who has indicated his/her understanding and acceptance.   Dental advisory given  Plan Discussed with: CRNA  Anesthesia Plan Comments:         Anesthesia Quick Evaluation

## 2013-01-21 NOTE — Transfer of Care (Signed)
Immediate Anesthesia Transfer of Care Note  Patient: Jeremiah Green  Procedure(s) Performed: Procedure(s) (LRB): CYSTOSCOPY WITH URETEROSCOPY AND STENT PLACEMENT (Left)  Patient Location: PACU  Anesthesia Type: General  Level of Consciousness: awake, alert  and oriented  Airway & Oxygen Therapy: Patient Spontanous Breathing and Patient connected to face mask oxygen  Post-op Assessment: Report given to PACU RN and Post -op Vital signs reviewed and stable  Post vital signs: Reviewed and stable  Complications: No apparent anesthesia complications

## 2013-01-21 NOTE — Op Note (Signed)
Urology Operative Report  Date of Procedure: 01/21/13  Surgeon: Natalia Leatherwood, MD Assistant:  None  Preoperative Diagnosis: Left ureter stone. Postoperative Diagnosis: Left ureter stone. Left ureter stenosis  Procedure(s): Cystoscopy Left ureteroscopy Left ureter sent placement.  Estimated blood loss: Minimal  Specimen: None  Drains: None  Complications: None  Findings: Left mid ureter stenosis.  History of present illness: 67 year old male presents today for ureteroscopy. He had a stone in his left ureter for which she underwent shockwave lithotripsy. This was unsuccessful and he presents today for ureteroscopy.   Procedure in detail: After informed consent was obtained, the patient was taken to the operating room. They were placed in the supine position. SCDs were turned on and in place. IV antibiotics were infused, and general anesthesia was induced. A timeout was performed in which the correct patient, surgical site, and procedure were identified and agreed upon by the team.  The patient was placed in a dorsolithotomy position, making sure to pad all pertinent neurovascular pressure points. A belladonna and opium suppository was placed into the rectum. The genitals were prepped and draped in the usual sterile fashion.  A rigid cystoscope was advanced through the urethra and into the bladder. 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 The bladder was drained and then fully distended and evaluated in a systematic fashion visualizing the entire surface of the bladder. This was negative for tumors.  Attention was turned to the left ureter orifice. It was noted to be somewhat small. It was cannulated with 2 sensor tip wires which were placed at the left renal pelvis on fluoroscopy. One wire was secured to the drape as a safety wire and the other was used as a working wire after the cystoscope was removed. Because  of the narrowing of the ureter orifice I first placed the obturator to 12/14 French ureter access sheath. This with ease into the distal ureter. I then placed the obturator and sheath over the working wire under fluoroscopy. This was placed with ease. I then removed the operator and sheath and inserted the digital flexible ureter scope. I was able to navigate up to an area located approximately L5 on the fluoroscopy. Here I encountered and narrowing area which I could not pass easily with the ureter scope. I replaced the sensor wire beyond this point under fluoroscopy and removed the ureter scope. I then replaced the obturator through the access sheath and tried to advance this beyond this area. I was not able to advance it beyond the area and therefore removed the operator again along with the wire and again looked in with the ureter scope. I was not able to access beyond this narrow area with the ureter scope and felt it would be best to place a ureter stent and return in an interval fashion.  The ureter scope was removed and the remainder of the ureter was visualized. There was no injury noted to the ureter. I then loaded the safety wire through the cystoscope and inserted a 6 x 24 double-J ureter stent without the tethering strings in place. This was placed under fluoroscopy with a good curl noted in the left renal pelvis and in the bladder. The bladder was drained and then I placed 10 cc of lidocaine jelly into the urethra.  This completed the procedure. Anesthesia was reversed, the patient placed in a supine position, and he was taken to the [facility] in stable condition.  He'll  be given Keflex to cover for the instrumentation in addition to the IV antibiotic to receive. I will have to reschedule for left ureteroscopy and 2-4 weeks.  All counts were correct at the end of the case.

## 2013-01-21 NOTE — Anesthesia Postprocedure Evaluation (Signed)
  Anesthesia Post-op Note  Patient: Jeremiah Green  Procedure(s) Performed: Procedure(s) (LRB): CYSTOSCOPY WITH URETEROSCOPY AND STENT PLACEMENT (Left)  Patient Location: PACU  Anesthesia Type: General  Level of Consciousness: awake and alert   Airway and Oxygen Therapy: Patient Spontanous Breathing  Post-op Pain: mild  Post-op Assessment: Post-op Vital signs reviewed, Patient's Cardiovascular Status Stable, Respiratory Function Stable, Patent Airway and No signs of Nausea or vomiting  Last Vitals:  Filed Vitals:   01/21/13 1815  BP: 189/100  Pulse: 70  Temp: 36.1 C  Resp: 20    Post-op Vital Signs: stable   Complications: No apparent anesthesia complications

## 2013-01-21 NOTE — Anesthesia Procedure Notes (Signed)
Procedure Name: LMA Insertion Date/Time: 01/21/2013 3:49 PM Performed by: Norva Pavlov Pre-anesthesia Checklist: Patient identified, Emergency Drugs available, Suction available and Patient being monitored Patient Re-evaluated:Patient Re-evaluated prior to inductionOxygen Delivery Method: Circle System Utilized Preoxygenation: Pre-oxygenation with 100% oxygen Intubation Type: IV induction Ventilation: Mask ventilation without difficulty LMA: LMA inserted LMA Size: 4.0 Number of attempts: 1 Airway Equipment and Method: bite block Placement Confirmation: positive ETCO2 Tube secured with: Tape Dental Injury: Teeth and Oropharynx as per pre-operative assessment

## 2013-01-22 ENCOUNTER — Encounter (HOSPITAL_BASED_OUTPATIENT_CLINIC_OR_DEPARTMENT_OTHER): Payer: Self-pay | Admitting: Urology

## 2013-01-22 LAB — GLUCOSE, CAPILLARY: Glucose-Capillary: 79 mg/dL (ref 70–99)

## 2013-01-23 ENCOUNTER — Other Ambulatory Visit: Payer: Self-pay | Admitting: Urology

## 2013-01-29 ENCOUNTER — Encounter (HOSPITAL_BASED_OUTPATIENT_CLINIC_OR_DEPARTMENT_OTHER): Payer: Self-pay | Admitting: *Deleted

## 2013-01-29 NOTE — Progress Notes (Addendum)
NPO AFTER MN. ARRIVES AT 0700. NEEDS ISTAT.  CURRENT EKG IN EPIC AND CHART. WILL TAKE NEXIUM AM OF SURG W/ SIP OF WATER AND IF NEEDED MAY TAKE RX PAIN/ NAUSEA .

## 2013-02-06 ENCOUNTER — Encounter (HOSPITAL_BASED_OUTPATIENT_CLINIC_OR_DEPARTMENT_OTHER): Payer: Self-pay | Admitting: Anesthesiology

## 2013-02-06 ENCOUNTER — Encounter (HOSPITAL_BASED_OUTPATIENT_CLINIC_OR_DEPARTMENT_OTHER): Payer: Self-pay

## 2013-02-06 ENCOUNTER — Ambulatory Visit (HOSPITAL_COMMUNITY): Payer: 59

## 2013-02-06 ENCOUNTER — Ambulatory Visit (HOSPITAL_BASED_OUTPATIENT_CLINIC_OR_DEPARTMENT_OTHER)
Admission: RE | Admit: 2013-02-06 | Discharge: 2013-02-06 | Disposition: A | Discharge status: Home / Self Care | Payer: 59 | Source: Ambulatory Visit | Attending: Urology | Admitting: Urology

## 2013-02-06 ENCOUNTER — Encounter (HOSPITAL_BASED_OUTPATIENT_CLINIC_OR_DEPARTMENT_OTHER)
Admission: RE | Disposition: A | Discharge status: Home / Self Care | Payer: Self-pay | Source: Ambulatory Visit | Attending: Urology

## 2013-02-06 ENCOUNTER — Ambulatory Visit (HOSPITAL_BASED_OUTPATIENT_CLINIC_OR_DEPARTMENT_OTHER): Payer: 59 | Admitting: Anesthesiology

## 2013-02-06 DIAGNOSIS — E119 Type 2 diabetes mellitus without complications: Secondary | ICD-10-CM | POA: Insufficient documentation

## 2013-02-06 DIAGNOSIS — N201 Calculus of ureter: Secondary | ICD-10-CM

## 2013-02-06 DIAGNOSIS — J45909 Unspecified asthma, uncomplicated: Secondary | ICD-10-CM | POA: Insufficient documentation

## 2013-02-06 DIAGNOSIS — N135 Crossing vessel and stricture of ureter without hydronephrosis: Secondary | ICD-10-CM | POA: Insufficient documentation

## 2013-02-06 DIAGNOSIS — K219 Gastro-esophageal reflux disease without esophagitis: Secondary | ICD-10-CM | POA: Insufficient documentation

## 2013-02-06 HISTORY — DX: Crossing vessel and stricture of ureter without hydronephrosis: N13.5

## 2013-02-06 HISTORY — PX: CYSTOSCOPY WITH RETROGRADE PYELOGRAM, URETEROSCOPY AND STENT PLACEMENT: SHX5789

## 2013-02-06 HISTORY — PX: HOLMIUM LASER APPLICATION: SHX5852

## 2013-02-06 HISTORY — PX: CYSTOSCOPY W/ URETERAL STENT PLACEMENT: SHX1429

## 2013-02-06 LAB — POCT I-STAT 4, (NA,K, GLUC, HGB,HCT)
Glucose, Bld: 208 mg/dL — ABNORMAL HIGH (ref 70–99)
HCT: 46 % (ref 39.0–52.0)
Hemoglobin: 15.6 g/dL (ref 13.0–17.0)
Potassium: 3.5 mEq/L (ref 3.5–5.1)
Sodium: 141 mEq/L (ref 135–145)

## 2013-02-06 LAB — GLUCOSE, CAPILLARY: Glucose-Capillary: 186 mg/dL — ABNORMAL HIGH (ref 70–99)

## 2013-02-06 SURGERY — CYSTOURETEROSCOPY, WITH RETROGRADE PYELOGRAM AND STENT INSERTION
Anesthesia: General | Site: Ureter | Laterality: Left | Wound class: Clean Contaminated

## 2013-02-06 MED ORDER — PROMETHAZINE HCL 25 MG/ML IJ SOLN
6.2500 mg | INTRAMUSCULAR | Status: DC | PRN
  Filled 2013-02-06: qty 1

## 2013-02-06 MED ORDER — MIDAZOLAM HCL 5 MG/5ML IJ SOLN
INTRAMUSCULAR | Status: DC | PRN
  Administered 2013-02-06: 1 mg via INTRAVENOUS

## 2013-02-06 MED ORDER — BELLADONNA ALKALOIDS-OPIUM 16.2-60 MG RE SUPP
RECTAL | Status: DC | PRN
  Administered 2013-02-06: 1 via RECTAL

## 2013-02-06 MED ORDER — ONDANSETRON HCL 4 MG/2ML IJ SOLN
INTRAMUSCULAR | Status: DC | PRN
  Administered 2013-02-06: 4 mg via INTRAVENOUS

## 2013-02-06 MED ORDER — OXYCODONE-ACETAMINOPHEN 5-325 MG PO TABS
1.0000 | ORAL_TABLET | ORAL | Status: DC | PRN
  Administered 2013-02-06: 1 via ORAL
  Filled 2013-02-06: qty 2

## 2013-02-06 MED ORDER — IOHEXOL 350 MG/ML SOLN
INTRAVENOUS | Status: DC | PRN
  Administered 2013-02-06: 1 mL

## 2013-02-06 MED ORDER — SODIUM CHLORIDE 0.9 % IR SOLN
Status: DC | PRN
  Administered 2013-02-06: 6000 mL

## 2013-02-06 MED ORDER — LACTATED RINGERS IV SOLN
INTRAVENOUS | Status: DC
  Administered 2013-02-06 (×2): via INTRAVENOUS
  Filled 2013-02-06: qty 1000

## 2013-02-06 MED ORDER — CEPHALEXIN 500 MG PO CAPS: 500.0000 mg | ORAL_CAPSULE | Freq: Three times a day (TID) | ORAL | Status: DC

## 2013-02-06 MED ORDER — FENTANYL CITRATE 0.05 MG/ML IJ SOLN
25.0000 ug | INTRAMUSCULAR | Status: DC | PRN
  Filled 2013-02-06: qty 1

## 2013-02-06 MED ORDER — KETOROLAC TROMETHAMINE 30 MG/ML IJ SOLN
INTRAMUSCULAR | Status: DC | PRN
  Administered 2013-02-06: 15 mg via INTRAVENOUS

## 2013-02-06 MED ORDER — PROPOFOL 10 MG/ML IV BOLUS
INTRAVENOUS | Status: DC | PRN
  Administered 2013-02-06: 30 mg via INTRAVENOUS
  Administered 2013-02-06: 150 mg via INTRAVENOUS

## 2013-02-06 MED ORDER — CEFAZOLIN SODIUM-DEXTROSE 2-3 GM-% IV SOLR
2.0000 g | INTRAVENOUS | Status: AC
  Administered 2013-02-06: 2 g via INTRAVENOUS
  Filled 2013-02-06: qty 50

## 2013-02-06 MED ORDER — FENTANYL CITRATE 0.05 MG/ML IJ SOLN
INTRAMUSCULAR | Status: DC | PRN
  Administered 2013-02-06 (×2): 50 ug via INTRAVENOUS

## 2013-02-06 MED ORDER — LIDOCAINE HCL (CARDIAC) 20 MG/ML IV SOLN
INTRAVENOUS | Status: DC | PRN
  Administered 2013-02-06: 50 mg via INTRAVENOUS

## 2013-02-06 MED ORDER — LIDOCAINE HCL 2 % EX GEL
CUTANEOUS | Status: DC | PRN
  Administered 2013-02-06: 1 via URETHRAL

## 2013-02-06 MED ORDER — PHENAZOPYRIDINE HCL 100 MG PO TABS
100.0000 mg | ORAL_TABLET | Freq: Three times a day (TID) | ORAL | Status: DC
  Administered 2013-02-06: 100 mg via ORAL
  Filled 2013-02-06: qty 1

## 2013-02-06 SURGICAL SUPPLY — 40 items
ADAPTER CATH URET PLST 4-6FR (CATHETERS) IMPLANT
BAG DRAIN URO-CYSTO SKYTR STRL (DRAIN) ×2 IMPLANT
BASKET LASER NITINOL 1.9FR (BASKET) IMPLANT
BASKET STNLS GEMINI 4WIRE 3FR (BASKET) IMPLANT
BASKET ZERO TIP NITINOL 2.4FR (BASKET) ×2 IMPLANT
BRUSH URET BIOPSY 3F (UROLOGICAL SUPPLIES) IMPLANT
CANISTER SUCT LVC 12 LTR MEDI- (MISCELLANEOUS) ×2 IMPLANT
CATH CLEAR GEL 3F BACKSTOP (CATHETERS) IMPLANT
CATH INTERMIT  6FR 70CM (CATHETERS) IMPLANT
CATH URET 5FR 28IN CONE TIP (BALLOONS)
CATH URET 5FR 28IN OPEN ENDED (CATHETERS) ×2 IMPLANT
CATH URET 5FR 70CM CONE TIP (BALLOONS) IMPLANT
CATH URET DUAL LUMEN 6-10FR 50 (CATHETERS) IMPLANT
CLOTH BEACON ORANGE TIMEOUT ST (SAFETY) ×2 IMPLANT
DRAPE CAMERA CLOSED 9X96 (DRAPES) ×2 IMPLANT
ELECT REM PT RETURN 9FT ADLT (ELECTROSURGICAL)
ELECTRODE REM PT RTRN 9FT ADLT (ELECTROSURGICAL) IMPLANT
GLOVE BIO SURGEON STRL SZ7 (GLOVE) ×2 IMPLANT
GLOVE BIOGEL M STER SZ 6 (GLOVE) ×2 IMPLANT
GLOVE BIOGEL PI IND STRL 6.5 (GLOVE) ×2 IMPLANT
GLOVE BIOGEL PI INDICATOR 6.5 (GLOVE) ×2
GLOVE ECLIPSE 7.0 STRL STRAW (GLOVE) IMPLANT
GLOVE INDICATOR 7.5 STRL GRN (GLOVE) ×2 IMPLANT
GOWN PREVENTION PLUS LG XLONG (DISPOSABLE) ×4 IMPLANT
GUIDEWIRE 0.038 PTFE COATED (WIRE) IMPLANT
GUIDEWIRE ANG ZIPWIRE 038X150 (WIRE) ×2 IMPLANT
GUIDEWIRE STR DUAL SENSOR (WIRE) ×4 IMPLANT
IV NS IRRIG 3000ML ARTHROMATIC (IV SOLUTION) ×2 IMPLANT
KIT BALLIN UROMAX 15FX10 (LABEL) IMPLANT
KIT BALLN UROMAX 15FX4 (MISCELLANEOUS) IMPLANT
KIT BALLN UROMAX 26 75X4 (MISCELLANEOUS)
LASER FIBER DISP (UROLOGICAL SUPPLIES) ×2 IMPLANT
PACK CYSTOSCOPY (CUSTOM PROCEDURE TRAY) ×2 IMPLANT
SET HIGH PRES BAL DIL (LABEL)
SHEATH ACCESS URETERAL 38CM (SHEATH) IMPLANT
SHEATH ACCESS URETERAL 54CM (SHEATH) ×2 IMPLANT
SHEATH URET ACCESS 12FR/35CM (UROLOGICAL SUPPLIES) IMPLANT
SHEATH URET ACCESS 12FR/55CM (UROLOGICAL SUPPLIES) IMPLANT
STENT URET 6FRX24 CONTOUR (STENTS) ×2 IMPLANT
SYRINGE IRR TOOMEY STRL 70CC (SYRINGE) IMPLANT

## 2013-02-06 NOTE — Anesthesia Procedure Notes (Signed)
Procedure Name: LMA Insertion Date/Time: 02/06/2013 8:25 AM Performed by: Maris Berger T Pre-anesthesia Checklist: Patient identified, Emergency Drugs available, Suction available and Patient being monitored Patient Re-evaluated:Patient Re-evaluated prior to inductionOxygen Delivery Method: Circle System Utilized Preoxygenation: Pre-oxygenation with 100% oxygen Intubation Type: IV induction Ventilation: Mask ventilation without difficulty LMA: LMA inserted LMA Size: 4.0 Number of attempts: 1 Airway Equipment and Method: bite block Placement Confirmation: positive ETCO2 Tube secured with: Tape Dental Injury: Teeth and Oropharynx as per pre-operative assessment

## 2013-02-06 NOTE — Anesthesia Postprocedure Evaluation (Signed)
  Anesthesia Post-op Note  Patient: Jeremiah Green  Procedure(s) Performed: Procedure(s) (LRB): CYSTOSCOPY , URETEROSCOPY   (Left) HOLMIUM LASER APPLICATION (Left) CYSTOSCOPY WITH STENT REPLACEMENT (Left)  Patient Location: PACU  Anesthesia Type: General  Level of Consciousness: awake and alert   Airway and Oxygen Therapy: Patient Spontanous Breathing  Post-op Pain: mild  Post-op Assessment: Post-op Vital signs reviewed, Patient's Cardiovascular Status Stable, Respiratory Function Stable, Patent Airway and No signs of Nausea or vomiting  Last Vitals:  Filed Vitals:   02/06/13 0930  BP: 127/85  Pulse: 59  Temp:   Resp: 14    Post-op Vital Signs: stable   Complications: No apparent anesthesia complications

## 2013-02-06 NOTE — Anesthesia Preprocedure Evaluation (Addendum)
Anesthesia Evaluation  Patient identified by MRN, date of birth, ID band Patient awake    Reviewed: Allergy & Precautions, H&P , NPO status , Patient's Chart, lab work & pertinent test results  Airway Mallampati: II TM Distance: >3 FB Neck ROM: Full    Dental  (+) Edentulous Upper and Edentulous Lower   Pulmonary asthma , former smoker,  breath sounds clear to auscultation  Pulmonary exam normal       Cardiovascular Exercise Tolerance: Good negative cardio ROS  Rhythm:Regular Rate:Normal  ECG: SB, ? LVH   Neuro/Psych negative neurological ROS  negative psych ROS   GI/Hepatic Neg liver ROS, GERD-  Medicated,  Endo/Other  diabetes, Type 2, Oral Hypoglycemic Agents  Renal/GU negative Renal ROS  negative genitourinary   Musculoskeletal negative musculoskeletal ROS (+)   Abdominal   Peds negative pediatric ROS (+)  Hematology negative hematology ROS (+)   Anesthesia Other Findings   Reproductive/Obstetrics negative OB ROS                         Anesthesia Physical Anesthesia Plan  ASA: II  Anesthesia Plan: General   Post-op Pain Management:    Induction: Intravenous  Airway Management Planned: LMA  Additional Equipment:   Intra-op Plan:   Post-operative Plan: Extubation in OR  Informed Consent: I have reviewed the patients History and Physical, chart, labs and discussed the procedure including the risks, benefits and alternatives for the proposed anesthesia with the patient or authorized representative who has indicated his/her understanding and acceptance.   Dental advisory given  Plan Discussed with: CRNA  Anesthesia Plan Comments: (Glucose 208 this AM. He states he has not taken diabetes med in a few days, but he will take it today after surgery. He feels fine.)       Anesthesia Quick Evaluation

## 2013-02-06 NOTE — Op Note (Signed)
Urology Operative Report  Date of Procedure: 02/06/13  Surgeon: Natalia Leatherwood, MD Assistant:  None  Preoperative Diagnosis: Left proximal ureter stone. Left ureter stenosis. Postoperative Diagnosis: Left proximal ureter stone.   Procedure(s): Left ureteroscopy with laser lithotripsy. Basket stone retrieval. Left ureter stent removal Left ureter stent placement. Fluoroscopy (<1 hour). Cystoscopy.  Estimated blood loss: None  Specimen: Stone sent to [facility] for chemical analysis  Drains: None  Complications: None  Findings: Negative left ureter stenosis. Large left proximal ureter stone.  History of present illness: 67 year old male presents today for interval ureteroscopy on the left side for left proximal ureter stone. I was unable to gain access to his last ureteroscopy and therefore placed a left ureter stent. He did have stenosis in his left ureter during the last procedure which could be due to the stone.   Procedure in detail: After informed consent was obtained, the patient was taken to the operating room. They were placed in the supine position. SCDs were turned on and in place. IV antibiotics were infused, and general anesthesia was induced. A timeout was performed in which the correct patient, surgical site, and procedure were identified and agreed upon by the team.  The patient was placed in a dorsolithotomy position, making sure to pad all pertinent neurovascular pressure points. A belladonna and opium suppository was placed into the rectum. The genitals were prepped and draped in the usual sterile fashion.  A rigid cystoscope was advanced through the urethra and into the bladder. The tip of the left ureter stent was grasped and brought urethra meatus. A sensor tip wire was placed up the left stent with ease and into the left renal pelvis on fluoroscopy. The left ureter stent was removed. A second sensor-tip wire was placed along side the initial wire giving me 2 wires.  One wire was secured to the drape as a safety wire and the other was used as a working wire. The bladder was drained. I then placed a 12/14 ureter access sheath over the working wire under fluoroscopy. This was able to be placed with ease. I was able to push the left proximal ureter stone into the renal pelvis on fluoroscopy. The obturator and working wire were then removed. I then placed a digital flexible ureter scope through the access sheath and into the left renal pelvis. This was explored in a systematic fashion. The only stone noted was one and one of the lower pole calyces which pushed back into the kidney from the proximal ureter. A 0 tip Nitinol basket was used to remove this midpole calyx for fragmentation. I then used a 200  holmium laser filament to perform lithotripsy at a setting of 0.5 J and 20 Hz. The stone was broken into 3 fragments. These were removed with a 0 tip Nitinol basket. All the stone fragments were removed. The kidney was then reexplored and there were no remaining fragments. I then removed the access sheath and the ureter scope and visualized the entire length of the ureter. There was no injury to the mucosa noted. I elected to place a left ureter stent with the string attached because I had used an access sheath. A 6 x 24 double-J stent was placed over the safety wire through the cystoscope. It was deployed with a good curl in the left renal pelvis on fluoroscopy and a good curl in the bladder. The bladder was drained and 10 cc of lidocaine jelly were placed into the urethra. The safety wire was secured to  the penis.  He was placed in a supine position, anesthesia was reversed, and he was taken to the [facility] in stable condition.  All counts were correct at the end of the case.  He will remove the stent on his own on 02/09/13. He will be provided with Keflex to cover for manipulation of the stent.

## 2013-02-06 NOTE — Transfer of Care (Signed)
Immediate Anesthesia Transfer of Care Note  Patient: Jeremiah Green  Procedure(s) Performed: Procedure(s): CYSTOSCOPY , URETEROSCOPY   (Left) HOLMIUM LASER APPLICATION (Left) CYSTOSCOPY WITH STENT REPLACEMENT (Left)  Patient Location: PACU  Anesthesia Type:General  Level of Consciousness: sedated  Airway & Oxygen Therapy: Patient Spontanous Breathing and Patient connected to nasal cannula oxygen  Post-op Assessment: Report given to PACU RN  Post vital signs: Reviewed and stable  Complications: No apparent anesthesia complications

## 2013-02-06 NOTE — H&P (Signed)
Urology History and Physical Exam  CC: Left ureter stone. Left ureter stenosis.  HPI:  67 year old male presents today for a left ureter stone.  This is 8 mm in size.  It was previously treated with shockwave lithotripsy which was unsuccessful in clearing his stone burden.  He underwent attempted ureteroscopy on 01/21/13.  He had ureter stenosis and also unable to place the ureteroscope to the level of the stone location.  A stent was placed in preparation for ureteroscopy.  We discussed risks, benefits, alternatives, and likelihood of achieving goals.  We discussed if he continues to have your stenosis how he would like to proceed.  We discussed percutaneous procedure via a percutaneous nephrostolithotomy versus balloon dilation of the strictured area.  We discussed risk and benefits of each.  He wishes to proceed with balloon dilation if I'm unable to complete ureteroscopy.  He denies any fever or chest pain since our procedure.  He presents today for cystoscopy, left ureteroscopy, laser lithotripsy, possible left retrograde pyelogram, and possible left ureter stent exchange.  Urine culture from 01/29/13 was negative for growth.  PMH: Past Medical History  Diagnosis Date  . Asthma   . Type 2 diabetes mellitus   . Left ureteral calculus   . GERD (gastroesophageal reflux disease)   . History of bladder stone   . History of glaucoma     BILATERAL --   S/P LASER REPAIR 2013  . Ureteral stenosis, left     PSH: Past Surgical History  Procedure Laterality Date  . Extracorporeal shock wave lithotripsy Left 12-10-2012  . Transurethral resection of prostate  2012    AND BLADDER STONE EXTRACTION  . Glaucoma surgery Bilateral 2013    LASER  . Cystoscopy with retrograde pyelogram, ureteroscopy and stent placement Left 01/21/2013    Procedure: CYSTOSCOPY WITH URETEROSCOPY AND STENT PLACEMENT;  Surgeon: Milford Cage, MD;  Location: [facility];  Service: Urology;  Laterality:  Left;    Allergies: Allergies  Allergen Reactions  . Sulfa Antibiotics Rash    Medications: Prescriptions prior to admission  Medication Sig Dispense Refill  . esomeprazole (NEXIUM) 20 MG capsule Take 20 mg by mouth as needed.      . Fluticasone-Salmeterol (ADVAIR) 500-50 MCG/DOSE AEPB Inhale 1 puff into the lungs as needed.       [name] Kitchen glimepiride (AMARYL) 2 MG tablet Take 2 mg by mouth 2 (two) times daily. PT STATES OUT OF REFILLS      . hyoscyamine (LEVSIN, ANASPAZ) 0.125 MG tablet Take 1 tablet (0.125 mg total) by mouth every 4 (four) hours as needed for cramping (bladder spasms).  40 tablet  4  . ondansetron (ZOFRAN) 4 MG tablet Take 4 mg by mouth every 8 (eight) hours as needed for nausea.      [name] Kitchen oxybutynin (DITROPAN) 5 MG tablet Take 1 tablet (5 mg total) by mouth every 6 (six) hours as needed (bladder spasms.).  40 tablet  4  . oxyCODONE-acetaminophen (PERCOCET/ROXICET) 5-325 MG per tablet Take 1-2 tablets by mouth every 4 (four) hours as needed for pain. For severe pain. Monitor for constipation.  60 tablet  0  . phenazopyridine (PYRIDIUM) 100 MG tablet Take 1 tablet (100 mg total) by mouth every 8 (eight) hours as needed for pain (Burning urination.  Will turn urine and body fluids orange.).  30 tablet  1  . senna-docusate (SENOKOT S) 8.6-50 MG per tablet Take 1 tablet by mouth 2 (two) times daily.  60 tablet  0  .  sitaGLIPtin (JANUVIA) 50 MG tablet Take 50 mg by mouth 2 (two) times daily with a meal.      . tamsulosin (FLOMAX) 0.4 MG CAPS Take 0.4 mg by mouth daily.         Social History: History   Social History  . Marital Status: Married    Spouse Name: N/A    Number of Children: N/A  . Years of Education: N/A   Occupational History  . Not on file.   Social History Main Topics  . Smoking status: Former Games developer  . Smokeless tobacco: Not on file  . Alcohol Use: No  . Drug Use: No  . Sexual Activity: Not on file   Other Topics Concern  . Not on file   Social  History Narrative  . No narrative on file    Family History: Family History  Problem Relation Age of Onset  . Cancer Mother   . Heart disease Father     Review of Systems: Positive: Urinary frequency. Negative: Fever, chest pain, or SOB.  A further 10 point review of systems was negative except what is listed in the HPI.  Physical Exam: Filed Vitals:   02/06/13 0720  BP: 161/97  Pulse: 76  Temp: 97.6 F (36.4 C)  Resp: 16    General: No acute distress.  Awake. Head:  Normocephalic.  Atraumatic. ENT:  EOMI.  Mucous membranes moist Neck:  Supple.  No lymphadenopathy. CV:  S1 present. S2 present. Regular rate. Pulmonary: Equal effort bilaterally.  Clear to auscultation bilaterally. Abdomen: Soft.  Non- tender to palpation. Skin:  Normal turgor.  No visible rash. Extremity: No gross deformity of bilateral upper extremities.  No gross deformity of    bilateral lower extremities. Neurologic: Alert. Appropriate mood.    Studies:  Recent Labs     02/06/13  0743  HGB  15.6    Recent Labs     02/06/13  0743  NA  141  K  3.5     No results found for this basename: PT, INR, APTT,  in the last 72 hours   No components found with this basename: ABG,     Assessment:  Left ureter stone. Left ureter stenosis.  Plan: To OR for cystoscopy, left ureteroscopy, laser lithotripsy, possible left retrograde pyelogram, and possible left ureter stent exchange, possible left ureter dilation.

## 2013-02-08 ENCOUNTER — Encounter (HOSPITAL_BASED_OUTPATIENT_CLINIC_OR_DEPARTMENT_OTHER): Payer: Self-pay | Admitting: Urology

## 2013-04-25 ENCOUNTER — Emergency Department (HOSPITAL_COMMUNITY)
Admission: EM | Admit: 2013-04-25 | Discharge: 2013-04-25 | Disposition: A | Discharge status: Home / Self Care | Payer: 59 | Attending: Emergency Medicine | Admitting: Emergency Medicine

## 2013-04-25 ENCOUNTER — Encounter (HOSPITAL_COMMUNITY): Payer: Self-pay | Admitting: Emergency Medicine

## 2013-04-25 ENCOUNTER — Ambulatory Visit (INDEPENDENT_AMBULATORY_CARE_PROVIDER_SITE_OTHER): Payer: Medicare Other | Admitting: Family Medicine

## 2013-04-25 VITALS — BP 142/88 | HR 81 | Temp 98.4°F | Resp 16 | Ht 69.0 in | Wt 160.0 lb

## 2013-04-25 DIAGNOSIS — R739 Hyperglycemia, unspecified: Secondary | ICD-10-CM

## 2013-04-25 DIAGNOSIS — R7309 Other abnormal glucose: Secondary | ICD-10-CM

## 2013-04-25 DIAGNOSIS — Z87891 Personal history of nicotine dependence: Secondary | ICD-10-CM | POA: Insufficient documentation

## 2013-04-25 DIAGNOSIS — E119 Type 2 diabetes mellitus without complications: Secondary | ICD-10-CM

## 2013-04-25 DIAGNOSIS — Z79899 Other long term (current) drug therapy: Secondary | ICD-10-CM | POA: Insufficient documentation

## 2013-04-25 DIAGNOSIS — IMO0001 Reserved for inherently not codable concepts without codable children: Secondary | ICD-10-CM | POA: Insufficient documentation

## 2013-04-25 DIAGNOSIS — R42 Dizziness and giddiness: Secondary | ICD-10-CM

## 2013-04-25 DIAGNOSIS — Z87448 Personal history of other diseases of urinary system: Secondary | ICD-10-CM | POA: Insufficient documentation

## 2013-04-25 DIAGNOSIS — J45909 Unspecified asthma, uncomplicated: Secondary | ICD-10-CM | POA: Insufficient documentation

## 2013-04-25 DIAGNOSIS — R269 Unspecified abnormalities of gait and mobility: Secondary | ICD-10-CM

## 2013-04-25 DIAGNOSIS — E1165 Type 2 diabetes mellitus with hyperglycemia: Secondary | ICD-10-CM

## 2013-04-25 DIAGNOSIS — R410 Disorientation, unspecified: Secondary | ICD-10-CM

## 2013-04-25 DIAGNOSIS — Z8719 Personal history of other diseases of the digestive system: Secondary | ICD-10-CM | POA: Insufficient documentation

## 2013-04-25 DIAGNOSIS — Z8669 Personal history of other diseases of the nervous system and sense organs: Secondary | ICD-10-CM | POA: Insufficient documentation

## 2013-04-25 LAB — CBC WITH DIFFERENTIAL/PLATELET
Basophils Absolute: 0.1 10*3/uL (ref 0.0–0.1)
Basophils Relative: 1 % (ref 0–1)
Eosinophils Absolute: 0.2 10*3/uL (ref 0.0–0.7)
Eosinophils Relative: 3 % (ref 0–5)
HCT: 41 % (ref 39.0–52.0)
Hemoglobin: 14.7 g/dL (ref 13.0–17.0)
Lymphocytes Relative: 45 % (ref 12–46)
Lymphs Abs: 2.2 10*3/uL (ref 0.7–4.0)
MCH: 32.7 pg (ref 26.0–34.0)
MCHC: 35.9 g/dL (ref 30.0–36.0)
MCV: 91.1 fL (ref 78.0–100.0)
Monocytes Absolute: 0.4 10*3/uL (ref 0.1–1.0)
Monocytes Relative: 8 % (ref 3–12)
Neutro Abs: 2.1 10*3/uL (ref 1.7–7.7)
Neutrophils Relative %: 43 % (ref 43–77)
Platelets: 101 10*3/uL — ABNORMAL LOW (ref 150–400)
RBC: 4.5 MIL/uL (ref 4.22–5.81)
RDW: 11.9 % (ref 11.5–15.5)
WBC: 4.8 10*3/uL (ref 4.0–10.5)

## 2013-04-25 LAB — BASIC METABOLIC PANEL
BUN: 20 mg/dL (ref 6–23)
CO2: 25 mEq/L (ref 19–32)
Calcium: 9.5 mg/dL (ref 8.4–10.5)
Chloride: 100 mEq/L (ref 96–112)
Creatinine, Ser: 1.32 mg/dL (ref 0.50–1.35)
GFR calc Af Amer: 62 mL/min — ABNORMAL LOW (ref >90)
GFR calc non Af Amer: 54 mL/min — ABNORMAL LOW (ref >90)
Glucose, Bld: 560 mg/dL (ref 70–99)
Potassium: 3.9 mEq/L (ref 3.5–5.1)
Sodium: 136 mEq/L (ref 135–145)

## 2013-04-25 LAB — URINALYSIS, ROUTINE W REFLEX MICROSCOPIC
Bilirubin Urine: NEGATIVE
Glucose, UA: >1000 mg/dL — AB
Hgb urine dipstick: NEGATIVE
Ketones, ur: NEGATIVE mg/dL
Leukocytes, UA: NEGATIVE
Nitrite: NEGATIVE
Protein, ur: NEGATIVE mg/dL
Specific Gravity, Urine: 1.036 — ABNORMAL HIGH (ref 1.005–1.030)
Urobilinogen, UA: 0.2 mg/dL (ref 0.0–1.0)
pH: 6 (ref 5.0–8.0)

## 2013-04-25 LAB — GLUCOSE, POCT (MANUAL RESULT ENTRY): POC Glucose: >444 mg/dl (ref 70–99)

## 2013-04-25 LAB — GLUCOSE, CAPILLARY
Glucose-Capillary: 592 mg/dL (ref 70–99)
Glucose-Capillary: 352 mg/dL — ABNORMAL HIGH (ref 70–99)
Glucose-Capillary: 435 mg/dL — ABNORMAL HIGH (ref 70–99)
Glucose-Capillary: 174 mg/dL — ABNORMAL HIGH (ref 70–99)
Glucose-Capillary: 191 mg/dL — ABNORMAL HIGH (ref 70–99)

## 2013-04-25 LAB — URINE MICROSCOPIC-ADD ON

## 2013-04-25 MED ORDER — SITAGLIPTIN PHOS-METFORMIN HCL 50-500 MG PO TABS: 1.0000 | ORAL_TABLET | Freq: Two times a day (BID) | ORAL | Status: AC

## 2013-04-25 MED ORDER — INSULIN ASPART 100 UNIT/ML ~~LOC~~ SOLN
10.0000 [IU] | Freq: Once | SUBCUTANEOUS | Status: AC
  Administered 2013-04-25: 10 [IU] via INTRAVENOUS
  Filled 2013-04-25: qty 1

## 2013-04-25 MED ORDER — INSULIN ASPART 100 UNIT/ML ~~LOC~~ SOLN: 15.0000 [IU] | Freq: Once | SUBCUTANEOUS | Status: DC

## 2013-04-25 MED ORDER — SODIUM CHLORIDE 0.9 % IV BOLUS (SEPSIS)
1000.0000 mL | Freq: Once | INTRAVENOUS | Status: AC
  Administered 2013-04-25: 1000 mL via INTRAVENOUS

## 2013-04-25 NOTE — ED Notes (Signed)
Pt reports that he went to [facility] for generalized fatigue over the past couple of days. Reports that he has been out of his diabetic medication for the past month.Staff reports that his CBG read above 400. Pt denies any pain at this time. Skin warm and dry. No other complaints.

## 2013-04-25 NOTE — ED Notes (Signed)
Notified RN CBG [id number]

## 2013-04-25 NOTE — ED Notes (Signed)
Notified RN of CBG 435

## 2013-04-25 NOTE — ED Notes (Addendum)
Pt to department via EMS- pt transported from [facility] for high CBG. EMS reports that his CBG read High on the meter. Pt denies any pain. Reports that he ran out of his diabetic pills about a month ago. Bp- 155/100 Hr-75

## 2013-04-25 NOTE — Progress Notes (Addendum)
Subjective:    Patient ID: Jeremiah Green, male    DOB: Jul 23, 1945, 66 y.o.   MRN: [id number]  This chart was scribed for Jeremiah Staggers, MD by Jeremiah Green, ED Scribe. The patient was seen in room 6. Patient's care was started at 8:40 AM.  PCP: Jeremiah [place], MD  HPI  Jeremiah Green is a 67 y.o. male who presents to office due to recorded hyperglycemia for the past three days. The readings have been too high to give him a number on the glucometer. He reports that he has had polydipsia, polyuria and blurred vision for two weeks. He has also been feeling confused for about a week. Yesterday evening he states his right knee "didn't feel right." He states his wife told him he was staggering four days ago. He has been out of his Januvia for a month, but did not refill the medication because he was told that it was affecting his kidneys. He started taking some leftover Janumet 5-500 (1 pill a day) after stopping the Januvia. He denies any abdominal pain, nausea, vomiting, chest pain, speech difficulty, weakness in extremities, or difficulty moving his face.   Past Medical History  Diagnosis Date  . Asthma   . Type 2 diabetes mellitus   . Left ureteral calculus   . GERD (gastroesophageal reflux disease)   . History of bladder stone   . History of glaucoma     BILATERAL --   S/P LASER REPAIR 2013  . Ureteral stenosis, left    There are no active problems to display for this patient.   Past Surgical History  Procedure Laterality Date  . Extracorporeal shock wave lithotripsy Left 12-10-2012  . Transurethral resection of prostate  2012    AND BLADDER STONE EXTRACTION  . Glaucoma surgery Bilateral 2013    LASER  . Cystoscopy with retrograde pyelogram, ureteroscopy and stent placement Left 01/21/2013    Procedure: CYSTOSCOPY WITH URETEROSCOPY AND STENT PLACEMENT;  Surgeon: Jeremiah Cage, MD;  Location: [facility];  Service: Urology;  Laterality: Left;  .  Cystoscopy with retrograde pyelogram, ureteroscopy and stent placement Left 02/06/2013    Procedure: CYSTOSCOPY , URETEROSCOPY  ;  Surgeon: Jeremiah Cage, MD;  Location: [facility];  Service: Urology;  Laterality: Left;  . Holmium laser application Left 02/06/2013    Procedure: HOLMIUM LASER APPLICATION;  Surgeon: Jeremiah Cage, MD;  Location: [facility];  Service: Urology;  Laterality: Left;  . Cystoscopy w/ ureteral stent placement Left 02/06/2013    Procedure: CYSTOSCOPY WITH STENT REPLACEMENT;  Surgeon: Jeremiah Cage, MD;  Location: [facility];  Service: Urology;  Laterality: Left;   Allergies  Allergen Reactions  . Sulfa Antibiotics Rash   Prior to Admission medications   Medication Sig Start Date End Date Taking? Authorizing Provider  Fluticasone-Salmeterol (ADVAIR) 500-50 MCG/DOSE AEPB Inhale 1 puff into the lungs as needed.    Yes Historical Provider, MD  sitaGLIPtin (JANUVIA) 50 MG tablet Take 50 mg by mouth 2 (two) times daily with a meal. 11/24/12  Yes Jeremiah P Le, DO  glimepiride (AMARYL) 2 MG tablet Take 2 mg by mouth 2 (two) times daily. PT STATES OUT OF REFILLS 11/24/12   Jeremiah Dayna Ramus, DO   History   Social History  . Marital Status: Married    Spouse Name: N/A    Number of Children: N/A  . Years of Education: N/A   Social History Main Topics  .  Smoking status: Former Games developer  . Smokeless tobacco: None  . Alcohol Use: No  . Drug Use: No  . Sexual Activity: None   Other Topics Concern  . None   Social History Narrative  . None    Review of Systems  Constitutional: Negative for fever.  Eyes: Positive for visual disturbance.  Cardiovascular: Negative for chest pain.  Gastrointestinal: Negative for vomiting and abdominal pain.  Endocrine: Positive for polydipsia and polyuria.  Neurological: Negative for weakness.  Psychiatric/Behavioral: Positive for confusion.       Objective:   Physical Exam    Nursing note and vitals reviewed. Constitutional: He is oriented to person, place, and time. He appears well-developed and well-nourished. No distress.  HENT:  Head: Normocephalic and atraumatic.  Few small white lesions on inside of lip and buccal.  Eyes: EOM are normal. Pupils are equal, round, and reactive to light.  Neck: Neck supple. No JVD present. Carotid bruit is not present. No tracheal deviation present.  Cardiovascular: Normal rate, regular rhythm and normal heart sounds.   No murmur heard. Pulmonary/Chest: Effort normal and breath sounds normal. No respiratory distress. He has no rales.  Musculoskeletal: Normal range of motion. He exhibits no edema.  Neurological: He is alert and oriented to person, place, and time.  Skin: Skin is warm and dry.  Psychiatric: He has a normal mood and affect. His behavior is normal.   Filed Vitals:   04/25/13 0831  BP: 142/88  Pulse: 81  Temp: 98.4 F (36.9 C)  Resp: 16  Height: 5\' 9"  (1.753 m)  Weight: 160 lb (72.576 kg)  SpO2: 98%   Results for orders placed in visit on 04/25/13  GLUCOSE, POCT (MANUAL RESULT ENTRY)      Result Value Range   POC Glucose >444  70 - 99 mg/dl       Assessment & Plan:  Jeremiah Green is a 66 y.o. male Hyperglycemia - Plan: POCT glucose (manual entry), EKG 12-Lead  Diabetes - Plan: EKG 12-Lead  Subacute confusional state  Abnormality of gait  DM2, with hyperglycemia, unknown duration of too high to read on home monitor noted past few days, confusion for past week or two and some staggering to gait. Hyperglycemia with mental status changes, gait abnormality - subacute without appreciable focal weakness. Dizziness with above sx's, no acute findings on EKG.  White patches in mouth - possible thrush - can be evaluated at hospital.   IV placed, EMS called for transport, charge nurse advised. Transfer of care to EMS at 0915.    No orders of the defined types were placed in this encounter.    There are no Patient Instructions on file for this visit.   I personally performed the services described in this documentation, which was scribed in my presence. The recorded information has been reviewed and considered, and addended by me as needed.

## 2013-04-25 NOTE — ED Provider Notes (Signed)
CSN: [id number]     Arrival date & time 04/25/13  4098 History   First MD Initiated Contact with Patient 04/25/13 [phone]     Chief Complaint  Patient presents with  . Hyperglycemia   (Consider location/radiation/quality/duration/timing/severity/associated sxs/prior Treatment) HPI  67yM with hyperglycemia. Sugars have been consistently high for past 3 days. About 2w hx of generalized fatigue, polyuria and polydipsia. He has been out of his Januvia for past month, but did not refill the medication because he was told that it was affecting his kidneys. Has been taking left over janumet and dosing at half of what was previously taking. No fever or chills. Denies any pain anywhere. No focal weakness. No SOB.   Past Medical History  Diagnosis Date  . Asthma   . Type 2 diabetes mellitus   . Left ureteral calculus   . GERD (gastroesophageal reflux disease)   . History of bladder stone   . History of glaucoma     BILATERAL --   S/P LASER REPAIR 2013  . Ureteral stenosis, left    Past Surgical History  Procedure Laterality Date  . Extracorporeal shock wave lithotripsy Left 12-10-2012  . Transurethral resection of prostate  2012    AND BLADDER STONE EXTRACTION  . Glaucoma surgery Bilateral 2013    LASER  . Cystoscopy with retrograde pyelogram, ureteroscopy and stent placement Left 01/21/2013    Procedure: CYSTOSCOPY WITH URETEROSCOPY AND STENT PLACEMENT;  Surgeon: Milford Cage, MD;  Location: [facility];  Service: Urology;  Laterality: Left;  . Cystoscopy with retrograde pyelogram, ureteroscopy and stent placement Left 02/06/2013    Procedure: CYSTOSCOPY , URETEROSCOPY  ;  Surgeon: Milford Cage, MD;  Location: [facility];  Service: Urology;  Laterality: Left;  . Holmium laser application Left 02/06/2013    Procedure: HOLMIUM LASER APPLICATION;  Surgeon: Milford Cage, MD;  Location: [facility];  Service: Urology;   Laterality: Left;  . Cystoscopy w/ ureteral stent placement Left 02/06/2013    Procedure: CYSTOSCOPY WITH STENT REPLACEMENT;  Surgeon: Milford Cage, MD;  Location: [facility];  Service: Urology;  Laterality: Left;   Family History  Problem Relation Age of Onset  . Cancer Mother   . Heart disease Father    History  Substance Use Topics  . Smoking status: Former Games developer  . Smokeless tobacco: Not on file  . Alcohol Use: No    Review of Systems  All systems reviewed and negative, other than as noted in HPI.   Allergies  Sulfa antibiotics  Home Medications   Current Outpatient Rx  Name  Route  Sig  Dispense  Refill  . Fluticasone-Salmeterol (ADVAIR) 500-50 MCG/DOSE AEPB   Inhalation   Inhale 1 puff into the lungs as needed.          [name] Kitchen glimepiride (AMARYL) 2 MG tablet   Oral   Take 2 mg by mouth 2 (two) times daily. PT STATES OUT OF REFILLS         . sitaGLIPtin (JANUVIA) 50 MG tablet   Oral   Take 50 mg by mouth 2 (two) times daily with a meal.          BP 151/85  Pulse 70  Temp(Src) 98.3 F (36.8 C) (Oral)  Resp 20  Ht 5\' 9"  (1.753 m)  Wt 160 lb (72.576 kg)  BMI 23.62 kg/m2  SpO2 98% Physical Exam  Nursing note and vitals reviewed. Constitutional: He appears well-developed and  well-nourished. No distress.  HENT:  Head: Normocephalic and atraumatic.  Eyes: Conjunctivae are normal. Right eye exhibits no discharge. Left eye exhibits no discharge.  Neck: Neck supple.  Cardiovascular: Normal rate, regular rhythm and normal heart sounds.  Exam reveals no gallop and no friction rub.   No murmur heard. Pulmonary/Chest: Effort normal and breath sounds normal. No respiratory distress.  Abdominal: Soft. He exhibits no distension. There is no tenderness.  Musculoskeletal: He exhibits no edema and no tenderness.  Neurological: He is alert.  Skin: Skin is warm and dry.  Psychiatric: He has a normal mood and affect. His behavior is normal.  Thought content normal.    ED Course  Procedures (including critical care time) Labs Review Labs Reviewed  GLUCOSE, CAPILLARY - Abnormal; Notable for the following:    Glucose-Capillary 592 (*)    All other components within normal limits  BASIC METABOLIC PANEL - Abnormal; Notable for the following:    Glucose, Bld 560 (*)    GFR calc non Af Amer 54 (*)    GFR calc Af Amer 62 (*)    All other components within normal limits  CBC WITH DIFFERENTIAL - Abnormal; Notable for the following:    Platelets 101 (*)    All other components within normal limits  URINALYSIS, ROUTINE W REFLEX MICROSCOPIC - Abnormal; Notable for the following:    Specific Gravity, Urine 1.036 (*)    Glucose, UA >1000 (*)    All other components within normal limits  GLUCOSE, CAPILLARY - Abnormal; Notable for the following:    Glucose-Capillary 435 (*)    All other components within normal limits  GLUCOSE, CAPILLARY - Abnormal; Notable for the following:    Glucose-Capillary 352 (*)    All other components within normal limits  GLUCOSE, CAPILLARY - Abnormal; Notable for the following:    Glucose-Capillary 174 (*)    All other components within normal limits  GLUCOSE, CAPILLARY - Abnormal; Notable for the following:    Glucose-Capillary 191 (*)    All other components within normal limits  URINE MICROSCOPIC-ADD ON  GLUCOSE, CAPILLARY   Imaging Review No results found.  EKG Interpretation   None       MDM   1. Hyperglycemia   2. Uncontrolled diabetes mellitus    67yM with hyperglycemia. Improved with IVF and insulin. Pt instructed to start back on janumet as previously taking. In review of records, renal dysfunction noted at time of ureteral stone. Suspect elevated Cr related to this and not medication related. Return precautions discussed. Outpt FU.     Raeford Razor, MD 04/26/13 [phone]

## 2013-04-26 ENCOUNTER — Ambulatory Visit (INDEPENDENT_AMBULATORY_CARE_PROVIDER_SITE_OTHER): Payer: Medicare Other | Admitting: Family Medicine

## 2013-04-26 ENCOUNTER — Observation Stay (HOSPITAL_COMMUNITY)
Admission: EM | Admit: 2013-04-26 | Discharge: 2013-04-27 | Disposition: A | Discharge status: Home / Self Care | Payer: 59 | Attending: Internal Medicine | Admitting: Internal Medicine

## 2013-04-26 ENCOUNTER — Encounter (HOSPITAL_COMMUNITY): Payer: Self-pay | Admitting: Emergency Medicine

## 2013-04-26 VITALS — BP 128/78 | HR 92 | Temp 99.0°F | Resp 16 | Ht 69.0 in | Wt 164.0 lb

## 2013-04-26 DIAGNOSIS — H409 Unspecified glaucoma: Secondary | ICD-10-CM | POA: Insufficient documentation

## 2013-04-26 DIAGNOSIS — Z9119 Patient's noncompliance with other medical treatment and regimen: Secondary | ICD-10-CM | POA: Insufficient documentation

## 2013-04-26 DIAGNOSIS — R7309 Other abnormal glucose: Secondary | ICD-10-CM

## 2013-04-26 DIAGNOSIS — Z87442 Personal history of urinary calculi: Secondary | ICD-10-CM | POA: Insufficient documentation

## 2013-04-26 DIAGNOSIS — R739 Hyperglycemia, unspecified: Secondary | ICD-10-CM

## 2013-04-26 DIAGNOSIS — IMO0001 Reserved for inherently not codable concepts without codable children: Principal | ICD-10-CM

## 2013-04-26 DIAGNOSIS — E860 Dehydration: Secondary | ICD-10-CM | POA: Insufficient documentation

## 2013-04-26 DIAGNOSIS — K449 Diaphragmatic hernia without obstruction or gangrene: Secondary | ICD-10-CM

## 2013-04-26 DIAGNOSIS — R5381 Other malaise: Secondary | ICD-10-CM

## 2013-04-26 DIAGNOSIS — K219 Gastro-esophageal reflux disease without esophagitis: Secondary | ICD-10-CM | POA: Insufficient documentation

## 2013-04-26 DIAGNOSIS — Z91199 Patient's noncompliance with other medical treatment and regimen due to unspecified reason: Secondary | ICD-10-CM | POA: Insufficient documentation

## 2013-04-26 DIAGNOSIS — J45909 Unspecified asthma, uncomplicated: Secondary | ICD-10-CM | POA: Insufficient documentation

## 2013-04-26 DIAGNOSIS — N200 Calculus of kidney: Secondary | ICD-10-CM

## 2013-04-26 DIAGNOSIS — I10 Essential (primary) hypertension: Secondary | ICD-10-CM | POA: Insufficient documentation

## 2013-04-26 DIAGNOSIS — E119 Type 2 diabetes mellitus without complications: Secondary | ICD-10-CM

## 2013-04-26 DIAGNOSIS — Z87891 Personal history of nicotine dependence: Secondary | ICD-10-CM | POA: Insufficient documentation

## 2013-04-26 DIAGNOSIS — R079 Chest pain, unspecified: Secondary | ICD-10-CM

## 2013-04-26 DIAGNOSIS — E11319 Type 2 diabetes mellitus with unspecified diabetic retinopathy without macular edema: Secondary | ICD-10-CM | POA: Insufficient documentation

## 2013-04-26 LAB — BASIC METABOLIC PANEL
BUN: 18 mg/dL (ref 6–23)
CO2: 23 mEq/L (ref 19–32)
Calcium: 9.2 mg/dL (ref 8.4–10.5)
Chloride: 101 mEq/L (ref 96–112)
Creatinine, Ser: 1.27 mg/dL (ref 0.50–1.35)
GFR calc Af Amer: 65 mL/min — ABNORMAL LOW (ref >90)
GFR calc non Af Amer: 56 mL/min — ABNORMAL LOW (ref >90)
Glucose, Bld: 531 mg/dL — ABNORMAL HIGH (ref 70–99)
Potassium: 4.3 mEq/L (ref 3.5–5.1)
Sodium: 136 mEq/L (ref 135–145)

## 2013-04-26 LAB — CBC WITH DIFFERENTIAL/PLATELET
Basophils Absolute: 0 10*3/uL (ref 0.0–0.1)
Basophils Relative: 1 % (ref 0–1)
Eosinophils Absolute: 0.2 10*3/uL (ref 0.0–0.7)
Eosinophils Relative: 4 % (ref 0–5)
HCT: 40.9 % (ref 39.0–52.0)
Hemoglobin: 14.1 g/dL (ref 13.0–17.0)
Lymphocytes Relative: 47 % — ABNORMAL HIGH (ref 12–46)
Lymphs Abs: 2.3 10*3/uL (ref 0.7–4.0)
MCH: 31.3 pg (ref 26.0–34.0)
MCHC: 34.5 g/dL (ref 30.0–36.0)
MCV: 90.9 fL (ref 78.0–100.0)
Monocytes Absolute: 0.4 10*3/uL (ref 0.1–1.0)
Monocytes Relative: 7 % (ref 3–12)
Neutro Abs: 2 10*3/uL (ref 1.7–7.7)
Neutrophils Relative %: 41 % — ABNORMAL LOW (ref 43–77)
Platelets: 92 10*3/uL — ABNORMAL LOW (ref 150–400)
RBC: 4.5 MIL/uL (ref 4.22–5.81)
RDW: 12.1 % (ref 11.5–15.5)
WBC: 4.9 10*3/uL (ref 4.0–10.5)

## 2013-04-26 LAB — URINALYSIS, ROUTINE W REFLEX MICROSCOPIC
Bilirubin Urine: NEGATIVE
Glucose, UA: >1000 mg/dL — AB
Hgb urine dipstick: NEGATIVE
Ketones, ur: NEGATIVE mg/dL
Leukocytes, UA: NEGATIVE
Nitrite: NEGATIVE
Protein, ur: NEGATIVE mg/dL
Specific Gravity, Urine: 1.031 — ABNORMAL HIGH (ref 1.005–1.030)
Urobilinogen, UA: 0.2 mg/dL (ref 0.0–1.0)
pH: 5.5 (ref 5.0–8.0)

## 2013-04-26 LAB — GLUCOSE, CAPILLARY
Glucose-Capillary: 458 mg/dL — ABNORMAL HIGH (ref 70–99)
Glucose-Capillary: 385 mg/dL — ABNORMAL HIGH (ref 70–99)
Glucose-Capillary: 211 mg/dL — ABNORMAL HIGH (ref 70–99)
Glucose-Capillary: 230 mg/dL — ABNORMAL HIGH (ref 70–99)

## 2013-04-26 LAB — URINE MICROSCOPIC-ADD ON: Urine-Other: NONE SEEN

## 2013-04-26 LAB — GLUCOSE, POCT (MANUAL RESULT ENTRY)

## 2013-04-26 MED ORDER — ENOXAPARIN SODIUM 40 MG/0.4ML ~~LOC~~ SOLN
40.0000 mg | SUBCUTANEOUS | Status: DC
  Administered 2013-04-26: 40 mg via SUBCUTANEOUS
  Filled 2013-04-26 (×2): qty 0.4

## 2013-04-26 MED ORDER — METFORMIN HCL 500 MG PO TABS
1000.0000 mg | ORAL_TABLET | Freq: Two times a day (BID) | ORAL | Status: DC
  Administered 2013-04-27: 1000 mg via ORAL
  Filled 2013-04-26 (×3): qty 2

## 2013-04-26 MED ORDER — NAPHAZOLINE HCL 0.1 % OP SOLN
1.0000 [drp] | Freq: Every day | OPHTHALMIC | Status: DC
  Administered 2013-04-27: 1 [drp] via OPHTHALMIC
  Filled 2013-04-26: qty 15

## 2013-04-26 MED ORDER — SITAGLIPTIN PHOS-METFORMIN HCL 50-500 MG PO TABS: 1.0000 | ORAL_TABLET | Freq: Two times a day (BID) | ORAL | Status: DC

## 2013-04-26 MED ORDER — LINAGLIPTIN 5 MG PO TABS
5.0000 mg | ORAL_TABLET | Freq: Every day | ORAL | Status: DC
  Administered 2013-04-27: 5 mg via ORAL
  Filled 2013-04-26: qty 1

## 2013-04-26 MED ORDER — ASPIRIN EC 81 MG PO TBEC
81.0000 mg | DELAYED_RELEASE_TABLET | Freq: Every day | ORAL | Status: DC
  Administered 2013-04-26 – 2013-04-27 (×2): 81 mg via ORAL
  Filled 2013-04-26 (×2): qty 1

## 2013-04-26 MED ORDER — ALBUTEROL SULFATE HFA 108 (90 BASE) MCG/ACT IN AERS
2.0000 | INHALATION_SPRAY | Freq: Four times a day (QID) | RESPIRATORY_TRACT | Status: DC | PRN
  Filled 2013-04-26: qty 6.7

## 2013-04-26 MED ORDER — LINAGLIPTIN 5 MG PO TABS: 5.0000 mg | ORAL_TABLET | Freq: Every day | ORAL | Status: DC

## 2013-04-26 MED ORDER — METFORMIN HCL 500 MG PO TABS
500.0000 mg | ORAL_TABLET | Freq: Two times a day (BID) | ORAL | Status: DC
  Filled 2013-04-26: qty 1

## 2013-04-26 MED ORDER — SODIUM CHLORIDE 0.9 % IV BOLUS (SEPSIS)
1000.0000 mL | Freq: Once | INTRAVENOUS | Status: AC
  Administered 2013-04-26: 1000 mL via INTRAVENOUS

## 2013-04-26 MED ORDER — INSULIN ASPART 100 UNIT/ML ~~LOC~~ SOLN
0.0000 [IU] | SUBCUTANEOUS | Status: DC
Start: 2013-04-26 — End: 2013-04-27
  Administered 2013-04-26: 5 [IU] via SUBCUTANEOUS
  Administered 2013-04-27: 3 [IU] via SUBCUTANEOUS
  Administered 2013-04-27: 5 [IU] via SUBCUTANEOUS

## 2013-04-26 MED ORDER — LISINOPRIL 2.5 MG PO TABS
2.5000 mg | ORAL_TABLET | Freq: Every day | ORAL | Status: DC
  Administered 2013-04-26 – 2013-04-27 (×2): 2.5 mg via ORAL
  Filled 2013-04-26 (×2): qty 1

## 2013-04-26 MED ORDER — INSULIN ASPART 100 UNIT/ML ~~LOC~~ SOLN
10.0000 [IU] | Freq: Once | SUBCUTANEOUS | Status: AC
  Administered 2013-04-26: 10 [IU] via SUBCUTANEOUS
  Filled 2013-04-26: qty 1

## 2013-04-26 MED ORDER — PNEUMOCOCCAL VAC POLYVALENT 25 MCG/0.5ML IJ INJ
0.5000 mL | INJECTION | INTRAMUSCULAR | Status: DC
  Filled 2013-04-26 (×2): qty 0.5

## 2013-04-26 MED ORDER — SODIUM CHLORIDE 0.9 % IV SOLN
INTRAVENOUS | Status: DC
  Administered 2013-04-26 – 2013-04-27 (×2): via INTRAVENOUS

## 2013-04-26 MED ORDER — ONDANSETRON HCL 4 MG/2ML IJ SOLN: 4.0000 mg | Freq: Three times a day (TID) | INTRAMUSCULAR | Status: AC | PRN

## 2013-04-26 MED ORDER — MOMETASONE FURO-FORMOTEROL FUM 100-5 MCG/ACT IN AERO
2.0000 | INHALATION_SPRAY | Freq: Two times a day (BID) | RESPIRATORY_TRACT | Status: DC
  Administered 2013-04-27: 2 via RESPIRATORY_TRACT
  Filled 2013-04-26: qty 8.8

## 2013-04-26 NOTE — ED Notes (Signed)
Bed: WA08 Expected date:  Expected time:  Means of arrival:  Comments: EMS-elevated sugar

## 2013-04-26 NOTE — Patient Instructions (Signed)
Follow up as determined in ER and to be discussed with your primary care provider.

## 2013-04-26 NOTE — ED Notes (Signed)
I attempted to get blood x1, was unsuccessful x1. Phlebotomist [place] notified.

## 2013-04-26 NOTE — ED Provider Notes (Signed)
CSN: [id number]     Arrival date & time 04/26/13  1525 History   First MD Initiated Contact with Patient 04/26/13 1528     Chief Complaint  Patient presents with  . Hyperglycemia   (Consider location/radiation/quality/duration/timing/severity/associated sxs/prior Treatment) HPI Comments: Patient is a 67 year old male with a past medical history of diabetes who presents with hyperglycemia. Symptoms started gradually and progressively worsened since the onset. Patient reports associated fatigue, polydipsia and polyuria. Patient reports switching his diabetic medications recently because he was having trouble with kidney stones. Patient now takes Janumet. He has been taking half of his normal dose. Patient denies any other associated symptoms. Patient was seen in the ED yesterday with the same complaint. Patient's glucose was controlled yesterday and he was sent home with a PCP follow up. Patient went to the Urgent Care where his PCP is and was sent back to the ED with expectations of being admitted for glucose control.    Past Medical History  Diagnosis Date  . Asthma   . Type 2 diabetes mellitus   . Left ureteral calculus   . GERD (gastroesophageal reflux disease)   . History of bladder stone   . History of glaucoma     BILATERAL --   S/P LASER REPAIR 2013  . Ureteral stenosis, left    Past Surgical History  Procedure Laterality Date  . Extracorporeal shock wave lithotripsy Left 12-10-2012  . Transurethral resection of prostate  2012    AND BLADDER STONE EXTRACTION  . Glaucoma surgery Bilateral 2013    LASER  . Cystoscopy with retrograde pyelogram, ureteroscopy and stent placement Left 01/21/2013    Procedure: CYSTOSCOPY WITH URETEROSCOPY AND STENT PLACEMENT;  Surgeon: Milford Cage, MD;  Location: [facility];  Service: Urology;  Laterality: Left;  . Cystoscopy with retrograde pyelogram, ureteroscopy and stent placement Left 02/06/2013    Procedure: CYSTOSCOPY ,  URETEROSCOPY  ;  Surgeon: Milford Cage, MD;  Location: [facility];  Service: Urology;  Laterality: Left;  . Holmium laser application Left 02/06/2013    Procedure: HOLMIUM LASER APPLICATION;  Surgeon: Milford Cage, MD;  Location: [facility];  Service: Urology;  Laterality: Left;  . Cystoscopy w/ ureteral stent placement Left 02/06/2013    Procedure: CYSTOSCOPY WITH STENT REPLACEMENT;  Surgeon: Milford Cage, MD;  Location: [facility];  Service: Urology;  Laterality: Left;   Family History  Problem Relation Age of Onset  . Cancer Mother   . Heart disease Father    History  Substance Use Topics  . Smoking status: Former Games developer  . Smokeless tobacco: Not on file  . Alcohol Use: No    Review of Systems  Constitutional: Negative for fever, chills and fatigue.  HENT: Negative for trouble swallowing.   Eyes: Negative for visual disturbance.  Respiratory: Negative for shortness of breath.   Cardiovascular: Negative for chest pain and palpitations.  Gastrointestinal: Negative for nausea, vomiting, abdominal pain and diarrhea.  Endocrine: Positive for polydipsia and polyuria.  Genitourinary: Negative for dysuria and difficulty urinating.  Musculoskeletal: Negative for arthralgias and neck pain.  Skin: Negative for color change.  Neurological: Negative for dizziness and weakness.  Psychiatric/Behavioral: Negative for dysphoric mood.    Allergies  Sulfa antibiotics  Home Medications   Current Outpatient Rx  Name  Route  Sig  Dispense  Refill  . CINNAMON PO   Oral   Take 1 tablet by mouth daily.         [name] Kitchen  Fluticasone-Salmeterol (ADVAIR) 250-50 MCG/DOSE AEPB   Inhalation   Inhale 1 puff into the lungs daily.         . naphazoline-glycerin (CLEAR EYES) 0.012-0.2 % SOLN   Both Eyes   Place 1 drop into both eyes daily.         . sitaGLIPtin-metformin (JANUMET) 50-500 MG per tablet   Oral   Take 1 tablet  by mouth 2 (two) times daily with a meal.   60 tablet   0   . albuterol (PROVENTIL HFA;VENTOLIN HFA) 108 (90 BASE) MCG/ACT inhaler   Inhalation   Inhale 2 puffs into the lungs every 6 (six) hours as needed for wheezing or shortness of breath.          BP 147/79  Pulse 79  Temp(Src) 98.2 F (36.8 C)  Resp 16  SpO2 97% Physical Exam  Nursing note and vitals reviewed. Constitutional: He is oriented to person, place, and time. He appears well-developed and well-nourished. No distress.  HENT:  Head: Normocephalic and atraumatic.  Eyes: Conjunctivae and EOM are normal.  Neck: Normal range of motion.  Cardiovascular: Normal rate and regular rhythm.  Exam reveals no gallop and no friction rub.   No murmur heard. Pulmonary/Chest: Effort normal and breath sounds normal. He has no wheezes. He has no rales. He exhibits no tenderness.  Abdominal: Soft. He exhibits no distension. There is no tenderness. There is no rebound and no guarding.  Musculoskeletal: Normal range of motion.  Neurological: He is alert and oriented to person, place, and time. Coordination normal.  Speech is goal-oriented. Moves limbs without ataxia.   Skin: Skin is warm and dry.  Psychiatric: He has a normal mood and affect. His behavior is normal.    ED Course  Procedures (including critical care time) Labs Review Labs Reviewed  CBC WITH DIFFERENTIAL - Abnormal; Notable for the following:    Platelets 92 (*)    Neutrophils Relative % 41 (*)    Lymphocytes Relative 47 (*)    All other components within normal limits  BASIC METABOLIC PANEL - Abnormal; Notable for the following:    Glucose, Bld 531 (*)    GFR calc non Af Amer 56 (*)    GFR calc Af Amer 65 (*)    All other components within normal limits  URINALYSIS, ROUTINE W REFLEX MICROSCOPIC - Abnormal; Notable for the following:    Specific Gravity, Urine 1.031 (*)    Glucose, UA >1000 (*)    All other components within normal limits  GLUCOSE, CAPILLARY  - Abnormal; Notable for the following:    Glucose-Capillary 458 (*)    All other components within normal limits  GLUCOSE, CAPILLARY - Abnormal; Notable for the following:    Glucose-Capillary 385 (*)    All other components within normal limits  GLUCOSE, CAPILLARY - Abnormal; Notable for the following:    Glucose-Capillary 211 (*)    All other components within normal limits  GLUCOSE, CAPILLARY - Abnormal; Notable for the following:    Glucose-Capillary 230 (*)    All other components within normal limits  URINE MICROSCOPIC-ADD ON  COMPREHENSIVE METABOLIC PANEL  CBC WITH DIFFERENTIAL  HEMOGLOBIN A1C  TSH  URINALYSIS, ROUTINE W REFLEX MICROSCOPIC  LIPID PANEL  TROPONIN I  TROPONIN I  TROPONIN I   Imaging Review No results found.  EKG Interpretation   None       MDM   1. Hyperglycemia   2. Diabetes   3. Kidney stone  4. Type II or unspecified type diabetes mellitus without mention of complication, uncontrolled     5:06 PM Labs pending. CBG shows 458. Patient will have fluids and 10 units of insulin. Vitals stable and patient afebrile.   5:44 PM Patient has an anion gap of 12. Remaining labs unremarkable for acute changes. Urinalysis shows no signs of infection. Patient will be monitored for glucose control.   Patient admitted to Dr. Joseph Art to stabilize and control glucose.   Emilia Beck, PA-C 04/27/13 0023

## 2013-04-26 NOTE — ED Notes (Signed)
Pe rEMS pt coming from [place] urgent care with c/o high blood sugar, ems reports pt was seen at [facility] yesterday for same and was d/c'd home, however symptoms have returned last night. Per EMS urgent care physician recommends pt to be admitted.

## 2013-04-26 NOTE — Progress Notes (Addendum)
This chart was scribed for [name], MD by [name], ED Scribe. This patient was seen in room Room/bed 7 and the patient's care was started at 2:06 PM. Subjective:    Patient ID: Jeremiah Green, male    DOB: May 11, 1945, 67 y.o.   MRN: [id number] Chief Complaint  Patient presents with  . pt states that he feels light   HPI Pt brought to Rm 7 after initial hx due to concerns of light-headedness and uncontrolled blood sugar.  Jeremiah Green is a 67 y.o. male with a hx of DM who presents to the [facility] for F/U apt. Pt last seen yesterday morning due to unable to read blood sugar on home meter for 3 days in a row. Too high to read. Had polydipsia, polyuria for 2 weeks and confusion for 1 week. Wife had noticed him staggering for a few days. Slight difficulty with speech yesterday. Blood sugar levels greater 444 in office with confusion and reported gait abnormality as well as uncontrolled DM. Sent by ambulance to [facility]. Initial glucose in ER of 592. Treated with IV fluids and insulin in the ER. Then instructed to start back on Janumet as previously taken.  Pt states his sugar got down to 192 after insulin in IV in the ER and instructed to take Janumet. Pt was coordinated to have care at [facility] to monitor progress. Pt states he had elevated levels yesterday evening after getting home from the ER at 3:30pm . Pt reports not being able to get a reading at 8 pm on his home monitor. He states he took Cinnamon to control blood sugar levels. He states he feels "high" similar to his younger days when he did drugs, make the correlation to feeling like he is floating. He states he feels he wants to sleep and at times feels he may fall over. Pt reports having increase in urination. Pt states he began having upper CP. He denies having current CP. Pt denies having weakness in his arms and legs. Pt denies feeling disoriented and confused. Pt denies fever and cough.  In July 2014,  Creatine was up to 2.23 and was changed to [place] and placed on Janumet due to concern with Metformin.Creatine yesterday in ER was 1.32 which is up from 1.30 from September.   Patient Active Problem List   Diagnosis Date Noted  . Diabetes 04/25/2013   Past Medical History  Diagnosis Date  . Asthma   . Type 2 diabetes mellitus   . Left ureteral calculus   . GERD (gastroesophageal reflux disease)   . History of bladder stone   . History of glaucoma     BILATERAL --   S/P LASER REPAIR 2013  . Ureteral stenosis, left    Past Surgical History  Procedure Laterality Date  . Extracorporeal shock wave lithotripsy Left 12-10-2012  . Transurethral resection of prostate  2012    AND BLADDER STONE EXTRACTION  . Glaucoma surgery Bilateral 2013    LASER  . Cystoscopy with retrograde pyelogram, ureteroscopy and stent placement Left 01/21/2013    Procedure: CYSTOSCOPY WITH URETEROSCOPY AND STENT PLACEMENT;  Surgeon: Milford Cage, MD;  Location: [facility];  Service: Urology;  Laterality: Left;  . Cystoscopy with retrograde pyelogram, ureteroscopy and stent placement Left 02/06/2013    Procedure: CYSTOSCOPY , URETEROSCOPY  ;  Surgeon: Milford Cage, MD;  Location: [facility];  Service: Urology;  Laterality: Left;  . Holmium laser application Left 02/06/2013  Procedure: HOLMIUM LASER APPLICATION;  Surgeon: Milford Cage, MD;  Location: [facility];  Service: Urology;  Laterality: Left;  . Cystoscopy w/ ureteral stent placement Left 02/06/2013    Procedure: CYSTOSCOPY WITH STENT REPLACEMENT;  Surgeon: Milford Cage, MD;  Location: [facility];  Service: Urology;  Laterality: Left;   Allergies  Allergen Reactions  . Sulfa Antibiotics Rash   Current outpatient prescriptions:albuterol (PROVENTIL HFA;VENTOLIN HFA) 108 (90 BASE) MCG/ACT inhaler, Inhale 2 puffs into the lungs every 6 (six) hours as needed for  wheezing or shortness of breath., Disp: , Rfl: ;  CINNAMON PO, Take 1 tablet by mouth daily., Disp: , Rfl: ;  Fluticasone-Salmeterol (ADVAIR) 500-50 MCG/DOSE AEPB, Inhale 1 puff into the lungs daily as needed., Disp: , Rfl:  naphazoline-glycerin (CLEAR EYES) 0.012-0.2 % SOLN, Place 1 drop into both eyes daily., Disp: , Rfl: ;  sitaGLIPtin-metformin (JANUMET) 50-500 MG per tablet, Take 0.5 tablets by mouth daily., Disp: , Rfl: ;  sitaGLIPtin-metformin (JANUMET) 50-500 MG per tablet, Take 1 tablet by mouth 2 (two) times daily with a meal., Disp: 60 tablet, Rfl: 0 History   Social History  . Marital Status: Married    Spouse Name: N/A    Number of Children: N/A  . Years of Education: N/A   Occupational History  . Not on file.   Social History Main Topics  . Smoking status: Former Games developer  . Smokeless tobacco: Not on file  . Alcohol Use: No  . Drug Use: No  . Sexual Activity: Not on file   Other Topics Concern  . Not on file   Social History Narrative  . No narrative on file   Review of Systems  Constitutional: Negative for fever.  Respiratory: Negative for cough.   Cardiovascular: Positive for chest pain.  Genitourinary: Positive for frequency.  Neurological: Negative for speech difficulty.  Psychiatric/Behavioral: Negative for confusion.       Objective:   Physical Exam  Vitals reviewed. Constitutional: He is oriented to person, place, and time. He appears well-developed and well-nourished.  HENT:  Head: Normocephalic and atraumatic.  Eyes: EOM are normal. Pupils are equal, round, and reactive to light.  Neck: No JVD present. Carotid bruit is not present.  Cardiovascular: Normal rate, regular rhythm and normal heart sounds.   No murmur heard. 2-6 L upper murmur.   Pulmonary/Chest: Effort normal and breath sounds normal. He has no rales.  Musculoskeletal: He exhibits no edema.  Neurological: He is alert and oriented to person, place, and time. He has normal strength. No  cranial nerve deficit or sensory deficit.  No slurred speech, on pronator drift,  nonfocal exam.   Skin: Skin is warm and dry.  Psychiatric: He has a normal mood and affect. His behavior is normal. Thought content normal.    Filed Vitals:   04/26/13 1358  BP: 128/78  Pulse: 92  Temp: 99 F (37.2 C)  TempSrc: Oral  Resp: 16  Height: 5\' 9"  (1.753 m)  Weight: 164 lb (74.39 kg)  SpO2: 96%   Assessment & Plan:   LENNIE VASCO is a 67 y.o. male Hyperglycemia - Plan: POCT glucose (manual entry), CANCELED: EKG 12-Lead  Chest pain - Plan: CANCELED: EKG 12-Lead  Hiatal hernia  Malaise and fatigue  recurrence of hyperglycemia and malaise, "high feeling", after treatment with IV insulin and IVF in ER yesterday, and attempt at restarting janumet - s/p 2 doses. hiatal hernia and typical pain in chest last night -  no chest pain at present. Prior had been changed to Januvia off Janumet out of concern of elevated creatinine.  EMS called for transport - sent to [facility] for eval and possible admission.   Patient Instructions  Follow up as determined in ER and to be discussed with your primary care provider.

## 2013-04-26 NOTE — H&P (Signed)
[facility] History and Physical  Jeremiah Green HYQ:[id number] DOB: Sep 02, 1945 DOA: 04/26/2013  Referring physician:  PCP: Lester [place], MD  Specialists:   Chief Complaint: Hyperglycemia  HPI: Jeremiah Green is a 67 y.o. BM PMHx  Asthma, left ureteral calculus, left ureteral stenosis, Hx bladder stone, Hx glaucoma uncontrolled diabetes type 2, noncompliance. Presented with hyperglycemia. Symptoms started gradually and progressively worsened since the onset. Patient reports associated fatigue, polydipsia and polyuria. Patient reports switching his diabetic medications recently because he was having trouble with kidney stones. Patient now takes Janumet. He has been taking half of his normal dose. Patient denies any other associated symptoms. Patient was seen in the ED yesterday with the same complaint. Patient's glucose was controlled yesterday and he was sent home with a PCP follow up. Patient went to the Urgent Care where his PCP works, however PCP on able to treat patient's mildly elevated CBG and therefore patient was instructed to ED with expectations of being admitted for glucose control.     Review of Systems: The patient denies anorexia, fever, weight loss,, vision loss, decreased hearing, hoarseness, chest pain, syncope, dyspnea on exertion, peripheral edema, balance deficits, hemoptysis, abdominal pain, melena, hematochezia, severe indigestion/heartburn, hematuria, incontinence, genital sores, muscle weakness, suspicious skin lesions, transient blindness, difficulty walking, depression, unusual weight change, abnormal bleeding, enlarged lymph nodes, angioedema, and breast masses.    TRAVEL HISTORY:   Procedure   Antibiotics    Past Medical History  Diagnosis Date  . Asthma   . Type 2 diabetes mellitus   . Left ureteral calculus   . GERD (gastroesophageal reflux disease)   . History of bladder stone   . History of glaucoma     BILATERAL --   S/P LASER REPAIR  2013  . Ureteral stenosis, left    Past Surgical History  Procedure Laterality Date  . Extracorporeal shock wave lithotripsy Left 12-10-2012  . Transurethral resection of prostate  2012    AND BLADDER STONE EXTRACTION  . Glaucoma surgery Bilateral 2013    LASER  . Cystoscopy with retrograde pyelogram, ureteroscopy and stent placement Left 01/21/2013    Procedure: CYSTOSCOPY WITH URETEROSCOPY AND STENT PLACEMENT;  Surgeon: Milford Cage, MD;  Location: [facility];  Service: Urology;  Laterality: Left;  . Cystoscopy with retrograde pyelogram, ureteroscopy and stent placement Left 02/06/2013    Procedure: CYSTOSCOPY , URETEROSCOPY  ;  Surgeon: Milford Cage, MD;  Location: [facility];  Service: Urology;  Laterality: Left;  . Holmium laser application Left 02/06/2013    Procedure: HOLMIUM LASER APPLICATION;  Surgeon: Milford Cage, MD;  Location: [facility];  Service: Urology;  Laterality: Left;  . Cystoscopy w/ ureteral stent placement Left 02/06/2013    Procedure: CYSTOSCOPY WITH STENT REPLACEMENT;  Surgeon: Milford Cage, MD;  Location: [facility];  Service: Urology;  Laterality: Left;   Social History:  reports that he has quit smoking. He does not have any smokeless tobacco history on file. He reports that he does not drink alcohol or use illicit drugs.  where does patient live--home, ALF, SNF? and with whom if at home? Can patient participate in ADLs?  Allergies  Allergen Reactions  . Sulfa Antibiotics Rash    Family History  Problem Relation Age of Onset  . Cancer Mother   . Heart disease Father     Prior to Admission medications   Medication Sig Start Date End Date Taking? Authorizing Provider  CINNAMON  PO Take 1 tablet by mouth daily.   Yes Historical Provider, MD  Fluticasone-Salmeterol (ADVAIR) 250-50 MCG/DOSE AEPB Inhale 1 puff into the lungs daily.   Yes Historical Provider, MD   naphazoline-glycerin (CLEAR EYES) 0.012-0.2 % SOLN Place 1 drop into both eyes daily.   Yes Historical Provider, MD  sitaGLIPtin-metformin (JANUMET) 50-500 MG per tablet Take 1 tablet by mouth 2 (two) times daily with a meal. 04/25/13  Yes Raeford Razor, MD  albuterol (PROVENTIL HFA;VENTOLIN HFA) 108 (90 BASE) MCG/ACT inhaler Inhale 2 puffs into the lungs every 6 (six) hours as needed for wheezing or shortness of breath.    Historical Provider, MD   Physical Exam: Filed Vitals:   04/26/13 1541 04/26/13 1942 04/26/13 2050  BP: 147/79 136/93 121/79  Pulse: 79 73 73  Temp: 98.2 F (36.8 C) 98.2 F (36.8 C)   TempSrc:  Oral   Resp: 16 16 16   SpO2: 97% 100% 98%     General:  A./O. x4, NAD  Eyes: Equal reactive to light and accommodation  Neck: Negative JVD, negative lymphadenopathy  Cardiovascular: Regular in rate, negative murmurs rubs or gallops, DP/PT pulse +2 bilateral  Respiratory: Clear to auscultation bilateral  Abdomen: Soft, nontender, nondistended, plus bowel sounds  Skin: Negative rash,  Musculoskeletal: Negative pedal edema, negative cyanosis  Neurologic: Cranial nerves II through XII intact, tongue/the midline, extremity strength 5/5, sensation intact throughout, did not ambulate patient  Labs on Admission:  Basic Metabolic Panel:  Recent Labs Lab 04/25/13 1030 04/26/13 1656  NA 136 136  K 3.9 4.3  CL 100 101  CO2 25 23  GLUCOSE 560* 531*  BUN 20 18  CREATININE 1.32 1.27  CALCIUM 9.5 9.2   Liver Function Tests: No results found for this basename: AST, ALT, ALKPHOS, BILITOT, PROT, ALBUMIN,  in the last 168 hours No results found for this basename: LIPASE, AMYLASE,  in the last 168 hours No results found for this basename: AMMONIA,  in the last 168 hours CBC:  Recent Labs Lab 04/25/13 1030 04/26/13 1656  WBC 4.8 4.9  NEUTROABS 2.1 2.0  HGB 14.7 14.1  HCT 41.0 40.9  MCV 91.1 90.9  PLT 101* 92*   Cardiac Enzymes: No results found for this  basename: CKTOTAL, CKMB, CKMBINDEX, TROPONINI,  in the last 168 hours  BNP (last 3 results) No results found for this basename: PROBNP,  in the last 8760 hours CBG:  Recent Labs Lab 04/25/13 1215 04/25/13 1427 04/25/13 1459 04/26/13 1621 04/26/13 1809  GLUCAP 352* 174* 191* 458* 385*    Radiological Exams on Admission: No results found.  EKG: NSR, nonspecific ST-T wave changes 3 and aVF   Assessment/Plan Active Problems:   Hyperglycemia  Diabetes type 2 uncontrolled -Increase patient on home dose of Janumet 250/1000 BID -Will use moderate SSI to help control patient's CBG while hospitalized; In a.m. based on amount of insulin required determine if additional medication required prior to discharge -Consult diabetic nutritionist -Consult diabetic coordinator -Normal saline at 130ml/hr secondary to dehydration from hyperglycemia. -Obtain lipid panel -Obtain hemoglobin A1c  HTN - Not within ADA guidelines -Start low-dose lisinopril, patient's BP will tolerate addition of beta blocker at this time.   Hyperglycemia  - see DM Type 2  Kidney stone -Currently no abdominal or CVA tenderness -On 02/06/2013 Dr. Natalia Leatherwood (neurologist) performed aLeft ureteroscopy with laser lithotripsy. Basket stone retrieval. Left ureter stent removal Left ureter stent placement.     Code Status: Full Family Communication: Wife present for  discussion of plan of care Disposition Plan: Discharge in the a.m. after patient sees diabetic nutritionist, diabetic coordinator  Time spent: 60 minutes  Martel Galvan, Roselind Messier [facility] Pager [phone]  If 7PM-7AM, please contact night-coverage [email or web address] Password [email or web address] 04/26/2013, 9:08 PM

## 2013-04-27 LAB — COMPREHENSIVE METABOLIC PANEL
ALT: 53 U/L (ref 0–53)
AST: 58 U/L — ABNORMAL HIGH (ref 0–37)
Albumin: 2.8 g/dL — ABNORMAL LOW (ref 3.5–5.2)
Alkaline Phosphatase: 57 U/L (ref 39–117)
BUN: 13 mg/dL (ref 6–23)
CO2: 22 mEq/L (ref 19–32)
Calcium: 8.3 mg/dL — ABNORMAL LOW (ref 8.4–10.5)
Chloride: 107 mEq/L (ref 96–112)
Creatinine, Ser: 1.11 mg/dL (ref 0.50–1.35)
GFR calc Af Amer: 77 mL/min — ABNORMAL LOW (ref >90)
GFR calc non Af Amer: 66 mL/min — ABNORMAL LOW (ref >90)
Glucose, Bld: 123 mg/dL — ABNORMAL HIGH (ref 70–99)
Potassium: 3 mEq/L — ABNORMAL LOW (ref 3.5–5.1)
Sodium: 139 mEq/L (ref 135–145)
Total Bilirubin: 0.6 mg/dL (ref 0.3–1.2)
Total Protein: 6 g/dL (ref 6.0–8.3)

## 2013-04-27 LAB — LIPID PANEL
Cholesterol: 163 mg/dL (ref 0–200)
HDL: 33 mg/dL — ABNORMAL LOW (ref >39)
LDL Cholesterol: 97 mg/dL (ref 0–99)
Total CHOL/HDL Ratio: 4.9 RATIO
Triglycerides: 167 mg/dL — ABNORMAL HIGH (ref <150)
VLDL: 33 mg/dL (ref 0–40)

## 2013-04-27 LAB — CBC WITH DIFFERENTIAL/PLATELET
Basophils Absolute: 0 10*3/uL (ref 0.0–0.1)
Basophils Relative: 1 % (ref 0–1)
Eosinophils Absolute: 0.3 10*3/uL (ref 0.0–0.7)
Eosinophils Relative: 6 % — ABNORMAL HIGH (ref 0–5)
HCT: 36.2 % — ABNORMAL LOW (ref 39.0–52.0)
Hemoglobin: 12.7 g/dL — ABNORMAL LOW (ref 13.0–17.0)
Lymphocytes Relative: 53 % — ABNORMAL HIGH (ref 12–46)
Lymphs Abs: 2.4 10*3/uL (ref 0.7–4.0)
MCH: 31.6 pg (ref 26.0–34.0)
MCHC: 35.1 g/dL (ref 30.0–36.0)
MCV: 90 fL (ref 78.0–100.0)
Monocytes Absolute: 0.4 10*3/uL (ref 0.1–1.0)
Monocytes Relative: 9 % (ref 3–12)
Neutro Abs: 1.4 10*3/uL — ABNORMAL LOW (ref 1.7–7.7)
Neutrophils Relative %: 31 % — ABNORMAL LOW (ref 43–77)
Platelets: 84 10*3/uL — ABNORMAL LOW (ref 150–400)
RBC: 4.02 MIL/uL — ABNORMAL LOW (ref 4.22–5.81)
RDW: 12.2 % (ref 11.5–15.5)
WBC: 4.6 10*3/uL (ref 4.0–10.5)

## 2013-04-27 LAB — URINALYSIS, ROUTINE W REFLEX MICROSCOPIC
Bilirubin Urine: NEGATIVE
Glucose, UA: >1000 mg/dL — AB
Hgb urine dipstick: NEGATIVE
Ketones, ur: NEGATIVE mg/dL
Leukocytes, UA: NEGATIVE
Nitrite: NEGATIVE
Protein, ur: NEGATIVE mg/dL
Specific Gravity, Urine: 1.03 (ref 1.005–1.030)
Urobilinogen, UA: 0.2 mg/dL (ref 0.0–1.0)
pH: 6 (ref 5.0–8.0)

## 2013-04-27 LAB — TROPONIN I
Troponin I: <0.3 ng/mL (ref <0.30)
Troponin I: <0.3 ng/mL (ref <0.30)
Troponin I: <0.3 ng/mL (ref <0.30)

## 2013-04-27 LAB — URINE MICROSCOPIC-ADD ON

## 2013-04-27 LAB — GLUCOSE, CAPILLARY
Glucose-Capillary: 172 mg/dL — ABNORMAL HIGH (ref 70–99)
Glucose-Capillary: 110 mg/dL — ABNORMAL HIGH (ref 70–99)
Glucose-Capillary: 214 mg/dL — ABNORMAL HIGH (ref 70–99)

## 2013-04-27 LAB — HEMOGLOBIN A1C
Hgb A1c MFr Bld: 13.1 % — ABNORMAL HIGH (ref <5.7)
Mean Plasma Glucose: 329 mg/dL — ABNORMAL HIGH (ref <117)

## 2013-04-27 LAB — TSH: TSH: 1.945 u[IU]/mL (ref 0.350–4.500)

## 2013-04-27 MED ORDER — POTASSIUM CHLORIDE CRYS ER 20 MEQ PO TBCR
40.0000 meq | EXTENDED_RELEASE_TABLET | Freq: Once | ORAL | Status: AC
  Administered 2013-04-27: 40 meq via ORAL
  Filled 2013-04-27: qty 2

## 2013-04-27 MED ORDER — CALCIUM CARBONATE ANTACID 500 MG PO CHEW
1.0000 | CHEWABLE_TABLET | Freq: Four times a day (QID) | ORAL | Status: DC | PRN
Start: 2013-04-27 — End: 2013-04-27
  Administered 2013-04-27: 200 mg via ORAL
  Filled 2013-04-27: qty 1

## 2013-04-27 MED ORDER — BD GETTING STARTED TAKE HOME KIT: 3/10ML X 30G SYRINGES
1.0000 | Freq: Once | Status: AC
  Administered 2013-04-27: 1
  Filled 2013-04-27: qty 1

## 2013-04-27 MED ORDER — LIVING WELL WITH DIABETES BOOK
Freq: Once | Status: AC
  Administered 2013-04-27: 14:00:00
  Filled 2013-04-27: qty 1

## 2013-04-27 MED ORDER — LISINOPRIL 2.5 MG PO TABS: 2.5000 mg | ORAL_TABLET | Freq: Every day | ORAL | Status: DC

## 2013-04-27 MED ORDER — ACETAMINOPHEN 325 MG PO TABS
650.0000 mg | ORAL_TABLET | Freq: Four times a day (QID) | ORAL | Status: DC | PRN
  Administered 2013-04-27: 650 mg via ORAL
  Filled 2013-04-27: qty 2

## 2013-04-27 NOTE — Progress Notes (Signed)
[facility] progress note. Chief complaint. Chest pain. History of present illness. This 67 year old male in hospital with hyperglycemia. He has no history of coronary artery disease. After finishing sandwich this evening he complained of chest pain to the nurse. This was epigastric/sternal area in origin. There was no radiation, diaphoresis, or nausea. A 12-lead EKG was obtained and this showed normal sinus rhythm without indication of ischemia. I came to see the patient at bedside and by the time I arrived he was chest pain-free. Vital signs. Temperature 97.9, pulse 64, respiration 16, blood pressure 155/88. O2 sats 100%. General appearance. Well-developed elderly male who is alert, cooperative, and in no distress. Cardiac. Rate and rhythm regular. Lungs. Breath sounds clear and equal. Abdomen. Soft with positive bowel sounds. No pain. Impression/plan. Problem #1. Chest pain. Suspect this is more likely secondary to GERD as pain onset came just after finishing a sandwich. EKG is not suggest acute ischemia. Will obtain troponin now and then every 6 hours for a total of 3 sets. We'll follow for any abnormality in troponins and notified cardiology if seen.

## 2013-04-27 NOTE — Plan of Care (Signed)
Problem: Food- and Nutrition-Related Knowledge Deficit (NB-1.1) Goal: Nutrition education Formal process to instruct or train a patient/client in a skill or to impart knowledge to help patients/clients voluntarily manage or modify food choices and eating behavior to maintain or improve health. Outcome: Completed/Met Date Met:  04/27/13  Nutrition Education Note  RD consulted for nutrition education regarding diabetes. RD spoke with wife on the phone. Discussed different food groups and their effects on blood sugar, emphasizing carbohydrate-containing foods. Provided list of carbohydrates and recommended serving sizes of common foods.  Discussed importance of controlled and consistent carbohydrate intake throughout the day. Provided examples of ways to balance meals/snacks and encouraged intake of high-fiber, whole grain complex carbohydrates. Teach back method used.  Expect good compliance.  Body mass index is 25.45 kg/(m^2). Pt meets criteria for Overweight based on current BMI.  Current diet order is Carbohydrate Modified Medium, patient is consuming approximately 75% of meals at this time. Labs and medications reviewed. No further nutrition interventions warranted at this time. RD contact information provided. If additional nutrition issues arise, please re-consult RD.  Jarold Motto MS, RD, LDN Pager: [phone] After-hours pager: [phone]

## 2013-04-27 NOTE — Discharge Summary (Signed)
Physician Discharge Summary  Jeremiah Green WUJ:[id number] DOB: June 07, 1945 DOA: 04/26/2013  PCP: Lester [place], MD  Admit date: 04/26/2013 Discharge date: 04/27/2013  Time spent: 45 minutes  Recommendations for Outpatient Follow-up:  -Will be discharged home today. -Advised to follow up with his PCP in 2 weeks. -BMET will need to be drawn to ensure renal function and electrolytes are WNL with newly started ACE-I.   Discharge Diagnoses:  Principal Problem:   Type II or unspecified type diabetes mellitus without mention of complication, uncontrolled Active Problems:   Diabetes   Hyperglycemia   Kidney stone   Discharge Condition: Stable and improved  Filed Weights   04/26/13 2100  Weight: 78.2 kg (172 lb 6.4 oz)    History of present illness:  Jeremiah Green is a 67 y.o. BM PMHx Asthma, left ureteral calculus, left ureteral stenosis, Hx bladder stone, Hx glaucoma uncontrolled diabetes type 2, noncompliance. Presented with hyperglycemia. Symptoms started gradually and progressively worsened since the onset. Patient reports associated fatigue, polydipsia and polyuria. Patient reports switching his diabetic medications recently because he was having trouble with kidney stones. Patient now takes Janumet. He has been taking half of his normal dose. Patient denies any other associated symptoms. Patient was seen in the ED yesterday with the same complaint. Patient's glucose was controlled yesterday and he was sent home with a PCP follow up. Patient went to the Urgent Care where his PCP works, however PCP on able to treat patient's mildly elevated CBG and therefore patient was instructed to ED with expectations of being admitted for glucose control.      Hospital Course:   Hyperglycemia in Dm 2 -It appears he was confused with his medications and started taking half a tablet of Janumet instead of the full tablet. -Prior to that his CBGs had been well controlled. -A1C is  pending. -But his CBGs have trended down into the 100s. -Do not believe he needs to go home on insulin at the moment. -Will place back on Janumet 50/500 mg BID. -Has been seen by the diabetes educator and the dietitian. -Advised to follow up with his PCP in 2 weeks.  HTN -BP has been in the 150s which is above goal for a diabetic. -Has been started on low-dose lisinopril 2.5 mg, and will need a BMET in 2 weeks to follow up on renal function and electrolytes.  Procedures:  None   Consultations:  None  Discharge Instructions  Discharge Orders   Future Orders Complete By Expires   Ambulatory referral to Nutrition and Diabetic Education  As directed    Diet Carb Modified  As directed    Discontinue IV  As directed    Increase activity slowly  As directed        Medication List         albuterol 108 (90 BASE) MCG/ACT inhaler  Commonly known as:  PROVENTIL HFA;VENTOLIN HFA  Inhale 2 puffs into the lungs every 6 (six) hours as needed for wheezing or shortness of breath.     CINNAMON PO  Take 1 tablet by mouth daily.     Fluticasone-Salmeterol 250-50 MCG/DOSE Aepb  Commonly known as:  ADVAIR  Inhale 1 puff into the lungs daily.     lisinopril 2.5 MG tablet  Commonly known as:  PRINIVIL,ZESTRIL  Take 1 tablet (2.5 mg total) by mouth daily.     naphazoline-glycerin 0.012-0.2 % Soln  Commonly known as:  CLEAR EYES  Place 1 drop into both eyes daily.  sitaGLIPtin-metformin 50-500 MG per tablet  Commonly known as:  JANUMET  Take 1 tablet by mouth 2 (two) times daily with a meal.       Allergies  Allergen Reactions  . Sulfa Antibiotics Rash       Follow-up Information   Follow up with [facility], MD. Schedule an appointment as soon as possible for a visit in 2 weeks.   Specialty:  Family Medicine   Contact information:   [address] [place] [address] [phone]        The results of significant diagnostics from this hospitalization  (including imaging, microbiology, ancillary and laboratory) are listed below for reference.    Significant Diagnostic Studies: No results found.  Microbiology: No results found for this or any previous visit (from the past 240 hour(s)).   Labs: Basic Metabolic Panel:  Recent Labs Lab 04/25/13 1030 04/26/13 1656 04/27/13 0557  NA 136 136 139  K 3.9 4.3 3.0*  CL 100 101 107  CO2 25 23 22   GLUCOSE 560* 531* 123*  BUN 20 18 13   CREATININE 1.32 1.27 1.11  CALCIUM 9.5 9.2 8.3*   Liver Function Tests:  Recent Labs Lab 04/27/13 0557  AST 58*  ALT 53  ALKPHOS 57  BILITOT 0.6  PROT 6.0  ALBUMIN 2.8*   No results found for this basename: LIPASE, AMYLASE,  in the last 168 hours No results found for this basename: AMMONIA,  in the last 168 hours CBC:  Recent Labs Lab 04/25/13 1030 04/26/13 1656 04/27/13 0557  WBC 4.8 4.9 4.6  NEUTROABS 2.1 2.0 1.4*  HGB 14.7 14.1 12.7*  HCT 41.0 40.9 36.2*  MCV 91.1 90.9 90.0  PLT 101* 92* 84*   Cardiac Enzymes:  Recent Labs Lab 04/27/13 0025 04/27/13 0557  TROPONINI <0.30 <0.30   BNP: BNP (last 3 results) No results found for this basename: PROBNP,  in the last 8760 hours CBG:  Recent Labs Lab 04/26/13 1809 04/26/13 2146 04/26/13 2320 04/27/13 0411 04/27/13 0742  GLUCAP 385* 211* 230* 172* 110*       Signed:  HERNANDEZ ACOSTA,ESTELA  [facility] Pager: [phone] 04/27/2013, 11:43 AM

## 2013-04-27 NOTE — Progress Notes (Signed)
Inpatient Diabetes Program Recommendations  AACE/ADA: New Consensus Statement on Inpatient Glycemic Control (2013)  Target Ranges:  Prepandial:   less than 140 mg/dL      Peak postprandial:   less than 180 mg/dL (1-2 hours)      Critically ill patients:  140 - 180 mg/dL   Reason for Assessment:  Refferal received in [facility].  Patient has had recent history of kidney stones and surgeries.  Presented to urgent care with CBG's reading high.  Talked on the phone with patient and wife.  They state that CBG's are usually in the 120 range.  Patient had cut his diabetes medications in 1/2 after surgery and they are wondering if this is what has caused the high CBG's.  A1C pending.  Based on A1C patient may need insulin at discharge?? Discussed with patient the signs and symptoms of hyperglycemia and possible need for medication intensification.  He has never had outpatient diabetes education.  Will order per protocol.  Will also order diabetes videos and "Living Well with Diabetes" booklet for patient.  Patient states he sometimes drinks sweet tea.  Encouraged him to cut the sugar out of his beverages.  Will discuss with Dr. Ardyth Harps.    Thanks, Beryl Meager, RN, BC-ADM Inpatient Diabetes Coordinator Pager [phone]

## 2013-04-27 NOTE — Progress Notes (Signed)
Utilization Review completed.  

## 2013-04-29 NOTE — ED Provider Notes (Signed)
Medical screening examination/treatment/procedure(s) were conducted as a shared visit with non-physician practitioner(s) and myself.  I personally evaluated the patient during the encounter.  EKG Interpretation    Date/Time:    Ventricular Rate:    PR Interval:    QRS Duration:   QT Interval:    QTC Calculation:   R Axis:     Text Interpretation:              Concern for ongoing hyperglycemia, now with 2nd ER visit and 1 primary care physician visit in 24 hours. Observation for blood sugar control recommended  Lyanne Co, MD 04/29/13 2134

## 2013-06-18 ENCOUNTER — Ambulatory Visit: Payer: 59

## 2013-06-25 ENCOUNTER — Ambulatory Visit: Payer: 59

## 2013-07-02 ENCOUNTER — Ambulatory Visit: Payer: 59

## 2014-06-28 IMAGING — CR DG LUMBAR SPINE 2-3V
2 series · 2 of 2 positions shown · non-contrast
Comparison: Abdominal series 11/22/2012

CLINICAL DATA: Back pain.

LUMBAR SPINE - 2-3 VIEW

[AP]
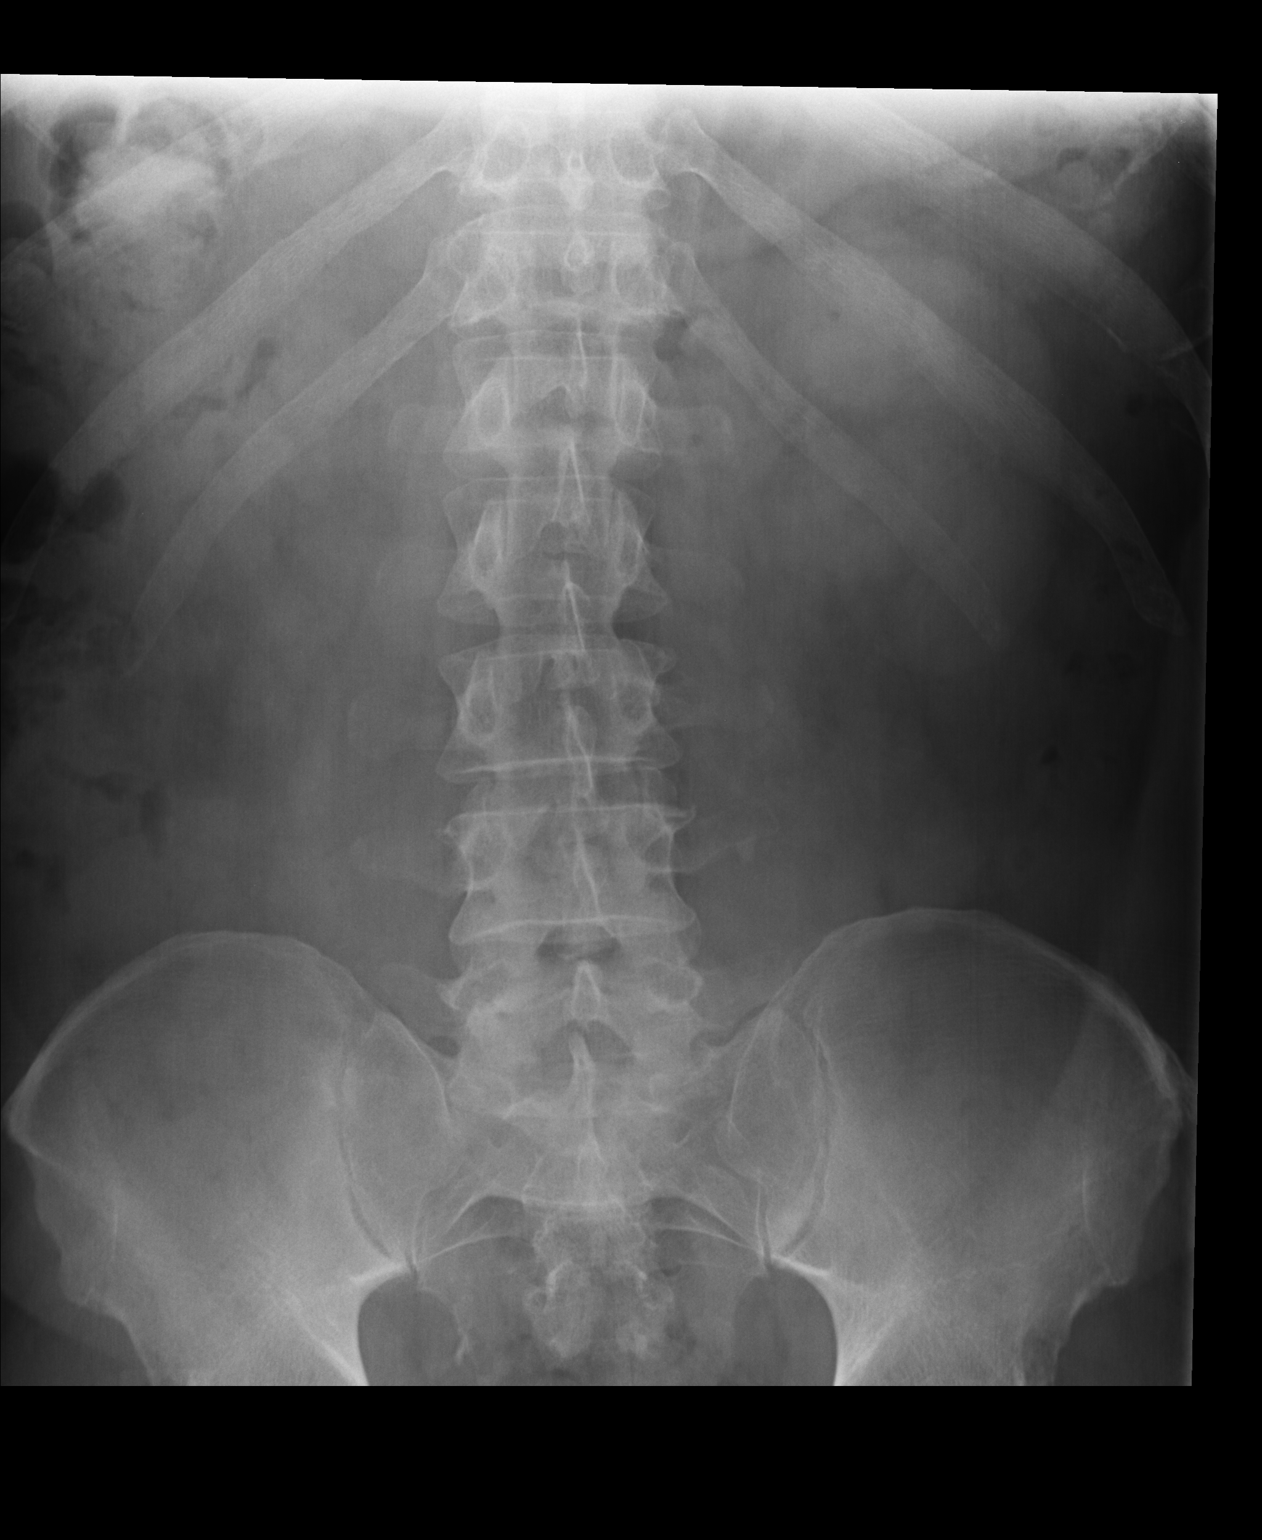

[lateral]
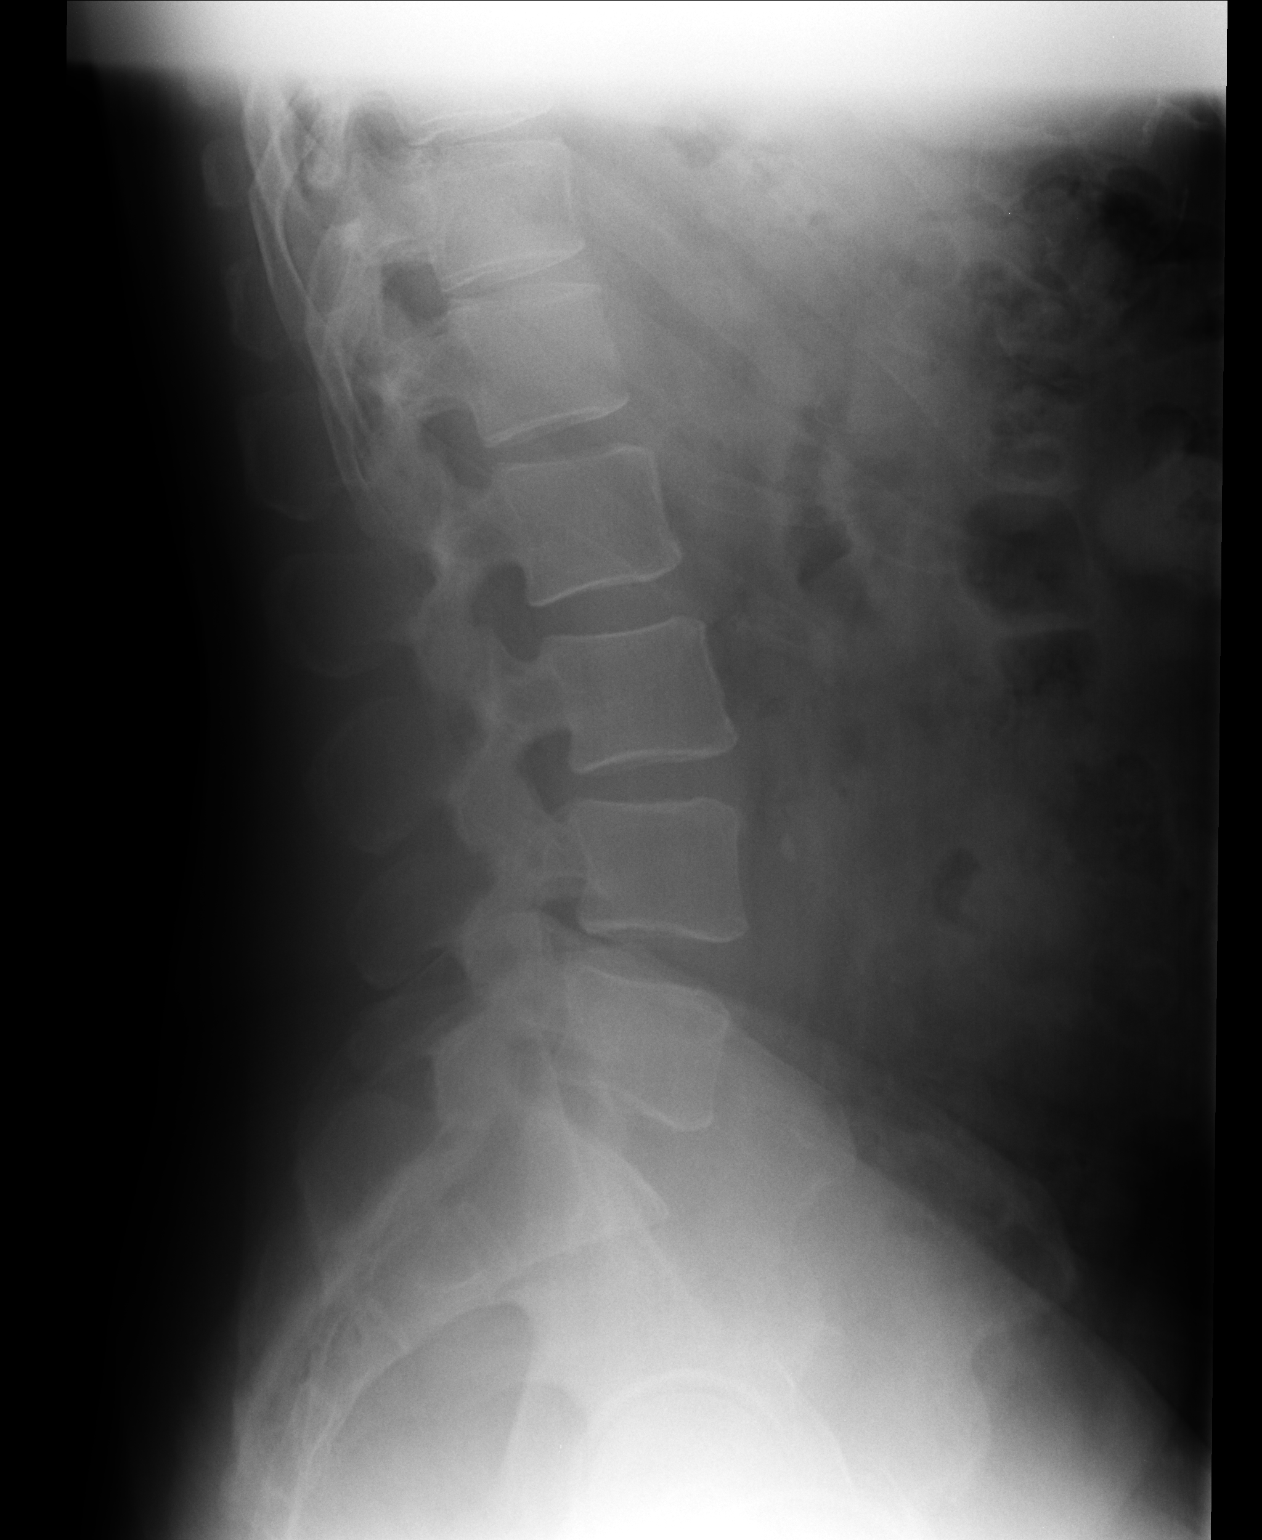

[2 of 2 positions shown; findings below may reference images not displayed]

FINDINGS: Two views of the lumbar spine were obtained.  Normal
alignment of the lumbar spine.  Vertebral body heights are
maintained.  8 mm calcification near the L4 left transverse
process.  This calcification could be in the left ureter.  No
definite calcifications overlying the renal shadows.  Difficult to
exclude soft tissue fullness in the left renal pelvic region.
IMPRESSION: 8 mm calcification in the region of the left ureter. This finding
could be further evaluated with CT if clinically indicated.

No acute bony abnormality in the lumbar spine.

These results will be called to the ordering clinician or
representative by the Radiologist Assistant, and communication
documented in the PACS Dashboard.

## 2014-06-28 IMAGING — CR DG ABDOMEN ACUTE W/ 1V CHEST
3 series · 3 of 3 positions shown · non-contrast
Comparison: None.

CLINICAL DATA: Abdominal and back pain.

ACUTE ABDOMEN SERIES (ABDOMEN 2 VIEW & CHEST 1 VIEW)

[PA]
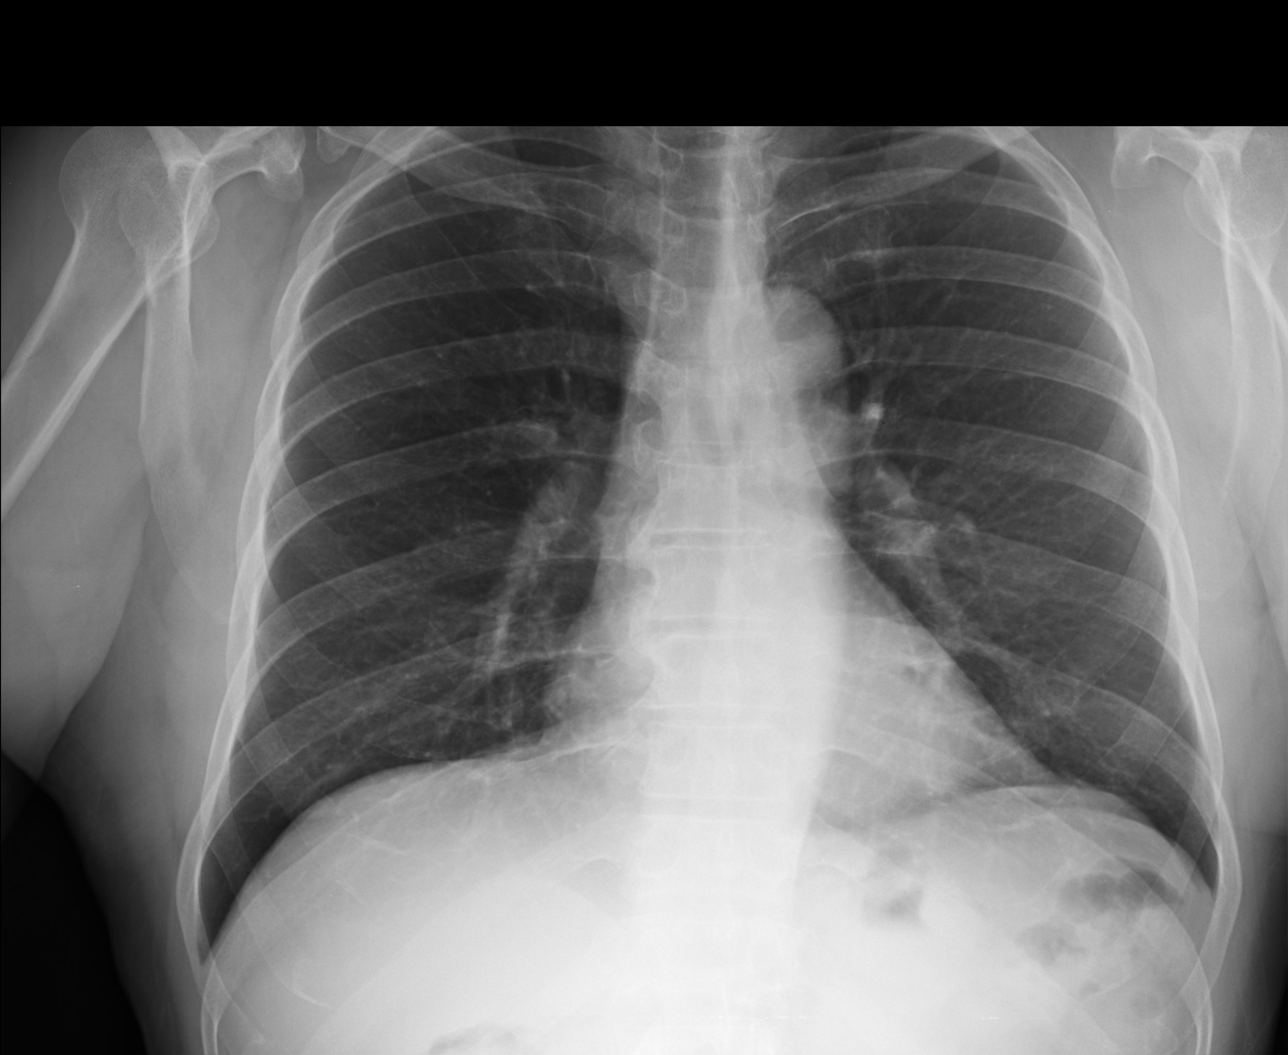

[AP (1 of 2)]
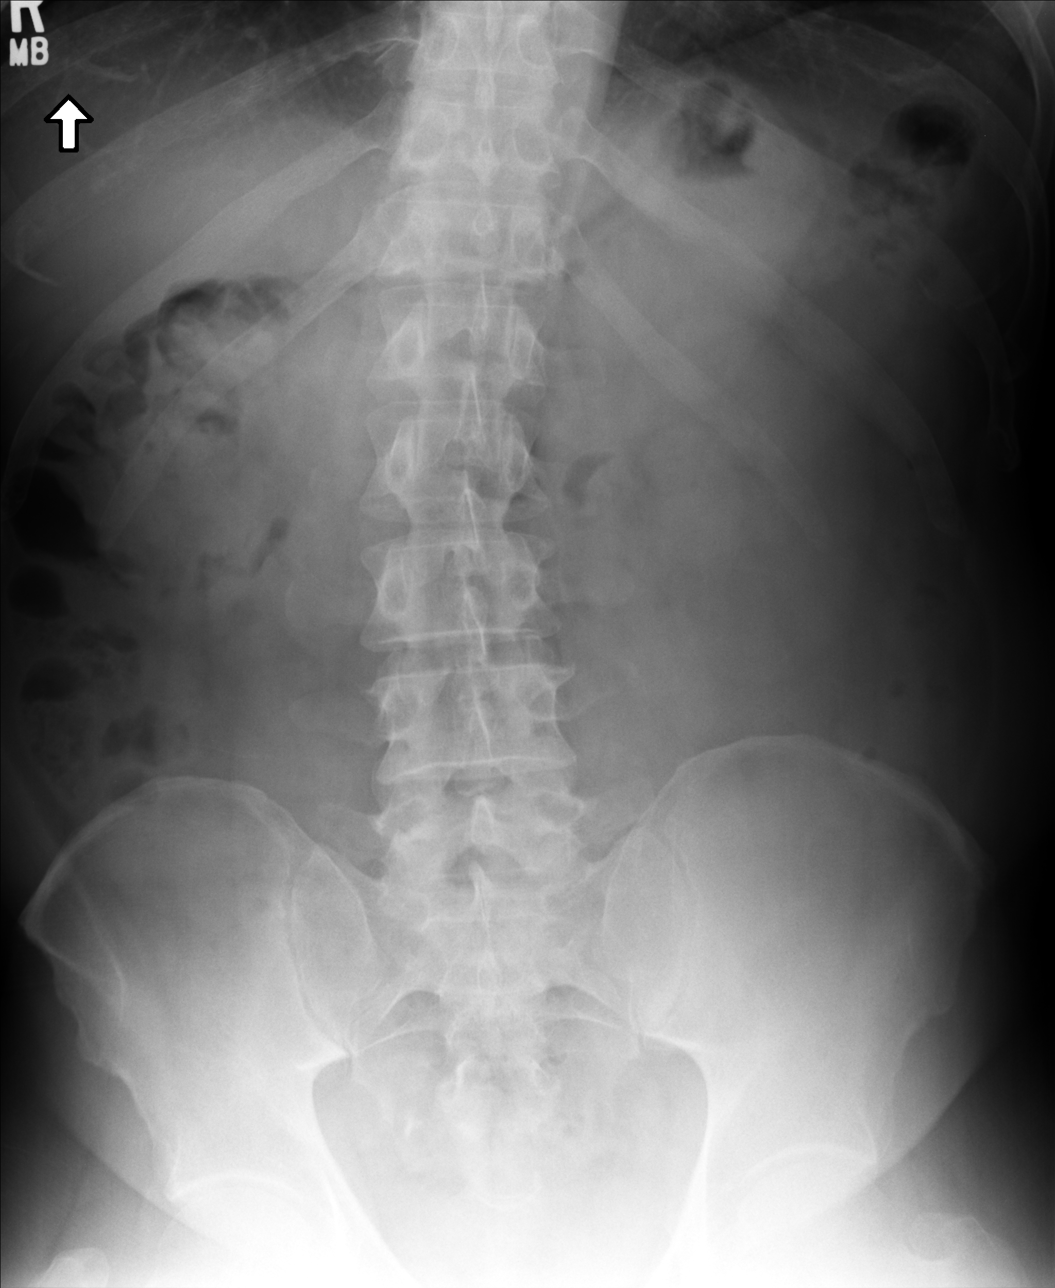

[AP (2 of 2)]
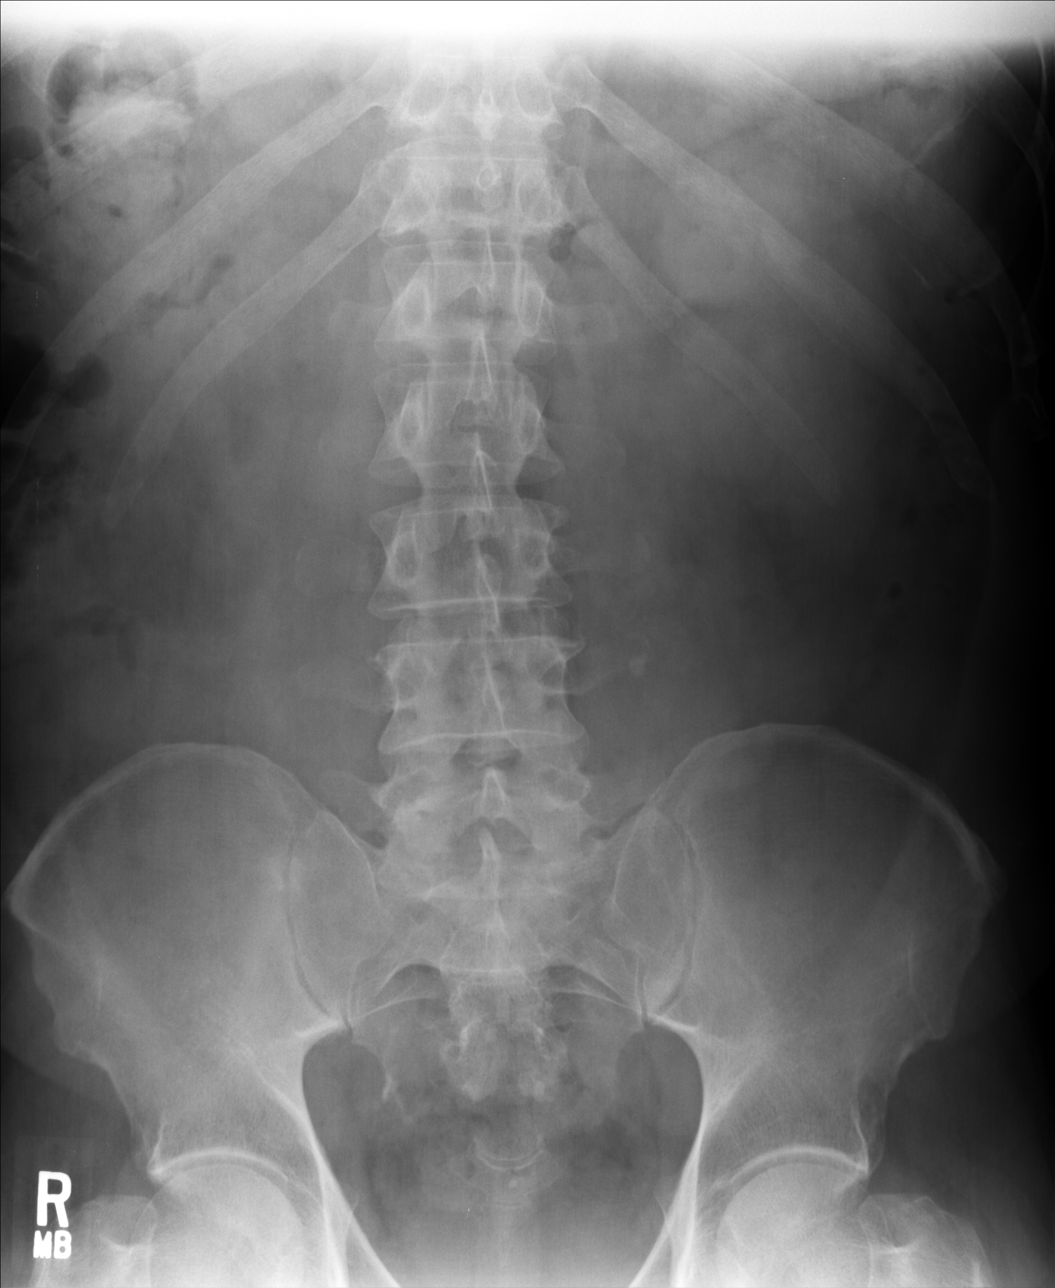

[3 of 3 positions shown; findings below may reference images not displayed]

FINDINGS: Chest radiograph demonstrates clear lungs.  Deformity of
the lateral right clavicle is suggestive for an old fracture. Heart
and mediastinum are within normal limits.  There is a nonspecific
bowel gas pattern.  There is an 8 mm calcification near the L4 left
transverse process.  The calcification could be in the region of
the left ureter.
IMPRESSION: No acute cardiopulmonary disease.

Nonspecific bowel gas pattern.

8 mm calcification near the L4 left transverse process.  Cannot
exclude a left ureter stone.

## 2015-03-18 ENCOUNTER — Ambulatory Visit (INDEPENDENT_AMBULATORY_CARE_PROVIDER_SITE_OTHER): Payer: 59 | Admitting: Family Medicine

## 2015-03-18 ENCOUNTER — Encounter: Payer: Self-pay | Admitting: Family Medicine

## 2015-03-18 VITALS — BP 116/72 | HR 71 | Temp 97.8°F | Resp 16 | Ht 68.5 in | Wt 167.4 lb

## 2015-03-18 DIAGNOSIS — N529 Male erectile dysfunction, unspecified: Secondary | ICD-10-CM | POA: Diagnosis not present

## 2015-03-18 DIAGNOSIS — E119 Type 2 diabetes mellitus without complications: Secondary | ICD-10-CM | POA: Diagnosis not present

## 2015-03-18 DIAGNOSIS — R059 Cough, unspecified: Secondary | ICD-10-CM

## 2015-03-18 DIAGNOSIS — Z130 Encounter for screening for diseases of the blood and blood-forming organs and certain disorders involving the immune mechanism: Secondary | ICD-10-CM | POA: Diagnosis not present

## 2015-03-18 DIAGNOSIS — R05 Cough: Secondary | ICD-10-CM

## 2015-03-18 LAB — CBC
HCT: 38.4 % — ABNORMAL LOW (ref 39.0–52.0)
Hemoglobin: 13 g/dL (ref 13.0–17.0)
MCH: 31.2 pg (ref 26.0–34.0)
MCHC: 33.9 g/dL (ref 30.0–36.0)
MCV: 92.1 fL (ref 78.0–100.0)
MPV: 11.8 fL (ref 8.6–12.4)
Platelets: 216 10*3/uL (ref 150–400)
RBC: 4.17 MIL/uL — ABNORMAL LOW (ref 4.22–5.81)
RDW: 13 % (ref 11.5–15.5)
WBC: 5.1 10*3/uL (ref 4.0–10.5)

## 2015-03-18 LAB — HEMOGLOBIN A1C
Hgb A1c MFr Bld: 6.2 % — ABNORMAL HIGH (ref <5.7)
Mean Plasma Glucose: 131 mg/dL — ABNORMAL HIGH (ref <117)

## 2015-03-18 MED ORDER — FLUTICASONE PROPIONATE 50 MCG/ACT NA SUSP: 2.0000 | Freq: Every day | NASAL | Status: AC

## 2015-03-18 NOTE — Progress Notes (Addendum)
[facility] [address], [place] [address] [phone]  Date:  03/18/2015   Name:  Jeremiah Green   DOB:  1946-05-05   MRN:  [id number]  PCP:  Lester [place], MD    Chief Complaint: new pt and estab care   History of Present Illness:  Jeremiah Green is a 69 y.o. very pleasant male patient who presents with the following:  Here today to establishcare with me- history of DM, HTN and asthma He had been cared for by Dr. Cyndia Diver.  He is not sure of his last A1c but thinks it was ok.  This was last done about 2 months ago. He is fasting today for labs.   He has had a pneumonia vaccine, he had his flu shot this year as well  He notes he has had a cough whenever he starts eating- he has noted this for about a year.  He may have the cough other times as well.  He is NOT on lisinopril    He has tried sipping on water when he eats- this does help some with the cough.    He quit smoking as a teenager- did not smoke for long.   He has been checked by GI and had a scope, had a CXR- all of this was negative He also tried taking acid reducers- also did not help  He is also troubled with ED- has tried viagra but it made his heart race too much to use  He has a history of glaucoma and cataracts but reports that both were treated and cured surgically   Lab Results  Component Value Date   HGBA1C 13.1* 04/27/2013     Patient Active Problem List   Diagnosis Date Noted  . Hyperglycemia 04/26/2013  . Type II or unspecified type diabetes mellitus without mention of complication, uncontrolled 04/26/2013  . Kidney stone 04/26/2013  . Diabetes (HCC) 04/25/2013    Past Medical History  Diagnosis Date  . Asthma   . Type 2 diabetes mellitus (HCC)   . Left ureteral calculus   . GERD (gastroesophageal reflux disease)   . History of bladder stone   . History of glaucoma     BILATERAL --   S/P LASER REPAIR 2013  . Ureteral stenosis, left     Past Surgical  History  Procedure Laterality Date  . Extracorporeal shock wave lithotripsy Left 12-10-2012  . Transurethral resection of prostate  2012    AND BLADDER STONE EXTRACTION  . Glaucoma surgery Bilateral 2013    LASER  . Cystoscopy with retrograde pyelogram, ureteroscopy and stent placement Left 01/21/2013    Procedure: CYSTOSCOPY WITH URETEROSCOPY AND STENT PLACEMENT;  Surgeon: Milford Cage, MD;  Location: [facility];  Service: Urology;  Laterality: Left;  . Cystoscopy with retrograde pyelogram, ureteroscopy and stent placement Left 02/06/2013    Procedure: CYSTOSCOPY , URETEROSCOPY  ;  Surgeon: Milford Cage, MD;  Location: [facility];  Service: Urology;  Laterality: Left;  . Holmium laser application Left 02/06/2013    Procedure: HOLMIUM LASER APPLICATION;  Surgeon: Milford Cage, MD;  Location: [facility];  Service: Urology;  Laterality: Left;  . Cystoscopy w/ ureteral stent placement Left 02/06/2013    Procedure: CYSTOSCOPY WITH STENT REPLACEMENT;  Surgeon: Milford Cage, MD;  Location: [facility];  Service: Urology;  Laterality: Left;    Social History  Substance Use Topics  . Smoking status:  Former Smoker  . Smokeless tobacco: None  . Alcohol Use: No    Family History  Problem Relation Age of Onset  . Cancer Mother   . Heart disease Father     Allergies  Allergen Reactions  . Losartan     Itchy rash  . Sulfa Antibiotics Rash    Medication list has been reviewed and updated.  Current Outpatient Prescriptions on File Prior to Visit  Medication Sig Dispense Refill  . albuterol (PROVENTIL HFA;VENTOLIN HFA) 108 (90 BASE) MCG/ACT inhaler Inhale 2 puffs into the lungs every 6 (six) hours as needed for wheezing or shortness of breath.    [name] Kitchen. CINNAMON PO Take 1 tablet by mouth daily.    . Fluticasone-Salmeterol (ADVAIR) 250-50 MCG/DOSE AEPB Inhale 1 puff into the lungs daily.    [name] Kitchen.  lisinopril (PRINIVIL,ZESTRIL) 2.5 MG tablet Take 1 tablet (2.5 mg total) by mouth daily. 30 tablet 1  . sitaGLIPtin-metformin (JANUMET) 50-500 MG per tablet Take 1 tablet by mouth 2 (two) times daily with a meal. 60 tablet 0  . naphazoline-glycerin (CLEAR EYES) 0.012-0.2 % SOLN Place 1 drop into both eyes daily.     No current facility-administered medications on file prior to visit.    Review of Systems:  As per HPI- otherwise negative.   Physical Examination: Filed Vitals:   03/18/15 1549  BP: 116/72  Pulse: 71  Temp: 97.8 F (36.6 C)  Resp: 16   Filed Vitals:   03/18/15 1549  Height: 5' 8.5" (1.74 m)  Weight: 167 lb 6.4 oz (75.932 kg)   Body mass index is 25.08 kg/(m^2). Ideal Body Weight: Weight in (lb) to have BMI = 25: 166.5  GEN: WDWN, NAD, Non-toxic, A & O x 3, looks well HEENT: Atraumatic, Normocephalic. Neck supple. No masses, No LAD. Ears and Nose: No external deformity. CV: RRR, No M/G/R. No JVD. No thrill. No extra heart sounds. PULM: CTA B, no wheezes, crackles, rhonchi. No retractions. No resp. distress. No accessory muscle use. EXTR: No c/c/e NEURO Normal gait.  PSYCH: Normally interactive. Conversant. Not depressed or anxious appearing.  Calm demeanor.  Right forearm: he injured this many years ago and had extensive surgery.  He has good but not perfect function of his right hand  Assessment and Plan: Controlled type 2 diabetes mellitus without complication, without long-term current use of insulin (HCC) - Plan: Comprehensive metabolic panel, Lipid panel, Hemoglobin A1c  Screening for deficiency anemia - Plan: CBC  Erectile dysfunction, unspecified erectile dysfunction type - Plan: Ambulatory referral to Urology  Cough - Plan: fluticasone (FLONASE) 50 MCG/ACT nasal spray  Await his labs and will plan follow-up as far as his DM Suggested that he try flonase with his optho MD's approval for his cough Referral to urology to discuss non- cGMP type  options for his ED  Signed Abbe AmsterdamJessica Copland, MD  Received his labs 11/10.  A1c is ok GRF is 63 so he can continue to use metformin but need to watch this.   He should be on chol medication. Called but could not reach at either number.  Will send a letter  Results for orders placed or performed in visit on 03/18/15  CBC  Result Value Ref Range   WBC 5.1 4.0 - 10.5 K/uL   RBC 4.17 (L) 4.22 - 5.81 MIL/uL   Hemoglobin 13.0 13.0 - 17.0 g/dL   HCT 16.138.4 (L) 09.639.0 - 04.552.0 %   MCV 92.1 78.0 - 100.0 fL   MCH 31.2 26.0 -  34.0 pg   MCHC 33.9 30.0 - 36.0 g/dL   RDW 16.1 09.6 - 04.5 %   Platelets 216 150 - 400 K/uL   MPV 11.8 8.6 - 12.4 fL  Comprehensive metabolic panel  Result Value Ref Range   Sodium 136 135 - 146 mmol/L   Potassium 4.4 3.5 - 5.3 mmol/L   Chloride 104 98 - 110 mmol/L   CO2 22 20 - 31 mmol/L   Glucose, Bld 83 65 - 99 mg/dL   BUN 21 7 - 25 mg/dL   Creat 4.09 (H) 8.11 - 1.25 mg/dL   Total Bilirubin 0.5 0.2 - 1.2 mg/dL   Alkaline Phosphatase 44 40 - 115 U/L   AST 38 (H) 10 - 35 U/L   ALT 33 9 - 46 U/L   Total Protein 7.3 6.1 - 8.1 g/dL   Albumin 4.1 3.6 - 5.1 g/dL   Calcium 9.3 8.6 - 91.4 mg/dL  Lipid panel  Result Value Ref Range   Cholesterol 219 (H) 125 - 200 mg/dL   Triglycerides 782 <956 mg/dL   HDL 48 >=21 mg/dL   Total CHOL/HDL Ratio 4.6 <=5.0 Ratio   VLDL 29 <30 mg/dL   LDL Cholesterol 308 (H) <130 mg/dL  Hemoglobin M5H  Result Value Ref Range   Hgb A1c MFr Bld 6.2 (H) <5.7 %   Mean Plasma Glucose 131 (H) <117 mg/dL

## 2015-03-18 NOTE — Patient Instructions (Addendum)
It was good to see you today- I will be in touch with your labs asap I am also not sure what is causing your cough with eating.   Let's try the flonase nasal spray for this- use it once a day. Since you have had surgery for your glaucoma and cataracts this should not cause any concern, but do check with your eye doctor prior to starting this medication I will refer you to urology to discuss other options for erectile dysfunction   Depending on your lab results we will plan to recheck in 4-6 months

## 2015-03-19 ENCOUNTER — Encounter: Payer: Self-pay | Admitting: Family Medicine

## 2015-03-19 LAB — COMPREHENSIVE METABOLIC PANEL
ALT: 33 U/L (ref 9–46)
AST: 38 U/L — ABNORMAL HIGH (ref 10–35)
Albumin: 4.1 g/dL (ref 3.6–5.1)
Alkaline Phosphatase: 44 U/L (ref 40–115)
BUN: 21 mg/dL (ref 7–25)
CO2: 22 mmol/L (ref 20–31)
Calcium: 9.3 mg/dL (ref 8.6–10.3)
Chloride: 104 mmol/L (ref 98–110)
Creat: 1.41 mg/dL — ABNORMAL HIGH (ref 0.70–1.25)
Glucose, Bld: 83 mg/dL (ref 65–99)
Potassium: 4.4 mmol/L (ref 3.5–5.3)
Sodium: 136 mmol/L (ref 135–146)
Total Bilirubin: 0.5 mg/dL (ref 0.2–1.2)
Total Protein: 7.3 g/dL (ref 6.1–8.1)

## 2015-03-19 LAB — LIPID PANEL
Cholesterol: 219 mg/dL — ABNORMAL HIGH (ref 125–200)
HDL: 48 mg/dL (ref >=40)
LDL Cholesterol: 142 mg/dL — ABNORMAL HIGH (ref <130)
Total CHOL/HDL Ratio: 4.6 Ratio (ref <=5.0)
Triglycerides: 147 mg/dL (ref <150)
VLDL: 29 mg/dL (ref <30)

## 2015-10-26 DIAGNOSIS — I10 Essential (primary) hypertension: Secondary | ICD-10-CM | POA: Insufficient documentation

## 2015-10-26 DIAGNOSIS — K219 Gastro-esophageal reflux disease without esophagitis: Secondary | ICD-10-CM | POA: Insufficient documentation

## 2015-10-26 DIAGNOSIS — J45909 Unspecified asthma, uncomplicated: Secondary | ICD-10-CM | POA: Insufficient documentation

## 2015-10-26 DIAGNOSIS — H3500 Unspecified background retinopathy: Secondary | ICD-10-CM | POA: Insufficient documentation

## 2015-10-26 DIAGNOSIS — F419 Anxiety disorder, unspecified: Secondary | ICD-10-CM | POA: Insufficient documentation

## 2015-10-26 DIAGNOSIS — B1920 Unspecified viral hepatitis C without hepatic coma: Secondary | ICD-10-CM | POA: Insufficient documentation

## 2015-10-26 DIAGNOSIS — E785 Hyperlipidemia, unspecified: Secondary | ICD-10-CM | POA: Insufficient documentation

## 2016-10-31 DIAGNOSIS — N183 Chronic kidney disease, stage 3 unspecified: Secondary | ICD-10-CM | POA: Insufficient documentation

## 2016-11-14 DIAGNOSIS — M7502 Adhesive capsulitis of left shoulder: Secondary | ICD-10-CM | POA: Insufficient documentation

## 2017-12-01 ENCOUNTER — Other Ambulatory Visit (HOSPITAL_COMMUNITY): Payer: Self-pay | Admitting: Orthopaedic Surgery

## 2017-12-01 DIAGNOSIS — M25512 Pain in left shoulder: Secondary | ICD-10-CM

## 2017-12-18 ENCOUNTER — Ambulatory Visit (HOSPITAL_COMMUNITY)
Admission: RE | Admit: 2017-12-18 | Discharge: 2017-12-18 | Disposition: A | Discharge status: Home / Self Care | Payer: Medicare HMO | Source: Ambulatory Visit | Attending: Orthopaedic Surgery | Admitting: Orthopaedic Surgery

## 2017-12-18 DIAGNOSIS — M25512 Pain in left shoulder: Secondary | ICD-10-CM | POA: Diagnosis present

## 2017-12-18 DIAGNOSIS — M75102 Unspecified rotator cuff tear or rupture of left shoulder, not specified as traumatic: Secondary | ICD-10-CM | POA: Diagnosis not present

## 2018-04-30 ENCOUNTER — Encounter (HOSPITAL_COMMUNITY): Payer: Self-pay | Admitting: Emergency Medicine

## 2018-04-30 ENCOUNTER — Other Ambulatory Visit: Payer: Self-pay

## 2018-04-30 ENCOUNTER — Ambulatory Visit (HOSPITAL_COMMUNITY)
Admission: EM | Admit: 2018-04-30 | Discharge: 2018-04-30 | Disposition: A | Discharge status: Home / Self Care | Payer: Medicare HMO | Attending: Family Medicine | Admitting: Family Medicine

## 2018-04-30 DIAGNOSIS — R739 Hyperglycemia, unspecified: Secondary | ICD-10-CM | POA: Diagnosis not present

## 2018-04-30 DIAGNOSIS — R110 Nausea: Secondary | ICD-10-CM | POA: Insufficient documentation

## 2018-04-30 LAB — POCT I-STAT, CHEM 8
BUN: 34 mg/dL — ABNORMAL HIGH (ref 8–23)
Calcium, Ion: 1.16 mmol/L (ref 1.15–1.40)
Chloride: 99 mmol/L (ref 98–111)
Creatinine, Ser: 1.8 mg/dL — ABNORMAL HIGH (ref 0.61–1.24)
Glucose, Bld: 587 mg/dL (ref 70–99)
HCT: 47 % (ref 39.0–52.0)
Hemoglobin: 16 g/dL (ref 13.0–17.0)
Potassium: 4.2 mmol/L (ref 3.5–5.1)
Sodium: 134 mmol/L — ABNORMAL LOW (ref 135–145)
TCO2: 24 mmol/L (ref 22–32)

## 2018-04-30 LAB — POCT URINALYSIS DIP (DEVICE)
Bilirubin Urine: NEGATIVE
Glucose, UA: 500 mg/dL — AB
Ketones, ur: NEGATIVE mg/dL
Leukocytes, UA: NEGATIVE
Nitrite: NEGATIVE
Protein, ur: NEGATIVE mg/dL
Specific Gravity, Urine: 1.015 (ref 1.005–1.030)
Urobilinogen, UA: 0.2 mg/dL (ref 0.0–1.0)
pH: 5 (ref 5.0–8.0)

## 2018-04-30 MED ORDER — ONDANSETRON 4 MG PO TBDP: 4.0000 mg | ORAL_TABLET | Freq: Three times a day (TID) | ORAL | 0 refills | Status: DC | PRN

## 2018-04-30 NOTE — Discharge Instructions (Signed)
Your EKG did not show anything alarming regarding your heart Please push plenty of fluids to help decrease blood sugar and help with any dehydration Contact your PCP on Thrusday when they repoen In the meantime over the next 2 days if you have worsening symptoms or blood sugar spiking higher to go to the emergency room

## 2018-04-30 NOTE — ED Notes (Signed)
[facility] stat chem 8 [facility]

## 2018-04-30 NOTE — ED Triage Notes (Signed)
Cough, nausea for a week.  Patient has bitter taste in mouth and poor appetite.  Overall feeling very tired and sleeping alot  2 weeks ago was told by pcp that fluid in ears, and prescribed a nasal spray.

## 2018-05-03 NOTE — ED Provider Notes (Signed)
[place]    CSN: [id number] Arrival date & time: 04/30/18  1650     History   Chief Complaint Chief Complaint  Patient presents with  . Cough    HPI Jeremiah Green is a 72 y.o. male history of asthma, GERD, DM type II presenting today for evaluation of nausea.  Patient states that he has had nausea, poor appetite, metallic taste in his mouth as well as feeling fatigued.  He has had a mild cough congestion, but his main complaint is lack of energy and nausea.  He is on Janumet for diabetes, blood sugars typically run 95-1 50.  Over the past week he has noticed his blood sugars being above 500 and not being able to be read by his glucometer.  He denies any vomiting.  Denies diarrhea.  Denies fevers.  Denies dysuria, but has had polyuria and polydipsia.  Cough overall has improved since symptoms started.  HPI  Past Medical History:  Diagnosis Date  . Asthma   . Cataract   . Crush injury, limb, upper    years ago, surgical repair  . GERD (gastroesophageal reflux disease)   . History of bladder stone   . History of glaucoma    BILATERAL --   S/P LASER REPAIR 2013  . Left ureteral calculus   . Seizures ([place])    10 years ago  . Type 2 diabetes mellitus ([place])   . Ureteral stenosis, left     Patient Active Problem List   Diagnosis Date Noted  . Hyperglycemia 04/26/2013  . Type II or unspecified type diabetes mellitus without mention of complication, uncontrolled 04/26/2013  . Kidney stone 04/26/2013  . Diabetes ([place]) 04/25/2013    Past Surgical History:  Procedure Laterality Date  . CYSTOSCOPY W/ URETERAL STENT PLACEMENT Left 02/06/2013   Procedure: CYSTOSCOPY WITH STENT REPLACEMENT;  Surgeon: Molli Hazard, MD;  Location: [facility];  Service: Urology;  Laterality: Left;  . CYSTOSCOPY WITH RETROGRADE PYELOGRAM, URETEROSCOPY AND STENT PLACEMENT Left 01/21/2013   Procedure: CYSTOSCOPY WITH URETEROSCOPY AND STENT PLACEMENT;  Surgeon:  Molli Hazard, MD;  Location[facility];  Service: Urology;  Laterality: Left;  . CYSTOSCOPY WITH RETROGRADE PYELOGRAM, URETEROSCOPY AND STENT PLACEMENT Left 02/06/2013   Procedure: CYSTOSCOPY , URETEROSCOPY  ;  Surgeon: Molli Hazard, MD;  Location: [facility];  Service: Urology;  Laterality: Left;  . EXTRACORPOREAL SHOCK WAVE LITHOTRIPSY Left 12-10-2012  . GLAUCOMA SURGERY Bilateral 2013   LASER  . HOLMIUM LASER APPLICATION Left 40/01/8118   Procedure: HOLMIUM LASER APPLICATION;  Surgeon: Molli Hazard, MD;  Location: [facility];  Service: Urology;  Laterality: Left;  . TRANSURETHRAL RESECTION OF PROSTATE  2012   AND BLADDER STONE EXTRACTION       Home Medications    Prior to Admission medications   Medication Sig Start Date End Date Taking? Authorizing Provider  rosuvastatin (CRESTOR) 10 MG tablet Take 10 mg by mouth daily.   Yes [provider]  albuterol (PROVENTIL HFA;VENTOLIN HFA) 108 (90 BASE) MCG/ACT inhaler Inhale 2 puffs into the lungs every 6 (six) hours as needed for wheezing or shortness of breath.    [provider]  Blood Glucose Monitoring Suppl (ONE TOUCH ULTRA SYSTEM KIT) W/DEVICE KIT 1 kit by Does not apply route once.    [provider]  CINNAMON PO Take 1 tablet by mouth daily.    [provider]  fluticasone (FLONASE) 50 MCG/ACT  nasal spray Place 2 sprays into both nostrils daily. 03/18/15   Copland, Gay Filler, MD  Fluticasone-Salmeterol (ADVAIR) 250-50 MCG/DOSE AEPB Inhale 1 puff into the lungs daily.    [provider]  naphazoline-glycerin (CLEAR EYES) 0.012-0.2 % SOLN Place 1 drop into both eyes daily.    [provider]  ondansetron (ZOFRAN ODT) 4 MG disintegrating tablet Take 1 tablet (4 mg total) by mouth every 8 (eight) hours as needed for nausea or vomiting. 04/30/18   Corran Lalone C, PA-C  sitaGLIPtin-metformin (JANUMET) 50-500 MG  per tablet Take 1 tablet by mouth 2 (two) times daily with a meal. 04/25/13   Virgel Manifold, MD  spironolactone (ALDACTONE) 25 MG tablet Take 25 mg by mouth daily.    [provider]    Family History Family History  Problem Relation Age of Onset  . Cancer Mother   . Heart disease Father   . Diabetes Sister   . Diabetes Brother   . Diabetes Brother     Social History Social History   Tobacco Use  . Smoking status: Former Smoker  Substance Use Topics  . Alcohol use: No  . Drug use: No     Allergies   Losartan and Sulfa antibiotics   Review of Systems Review of Systems  Constitutional: Positive for appetite change and fatigue. Negative for activity change, chills and fever.  HENT: Positive for congestion and rhinorrhea. Negative for ear pain, sinus pressure, sore throat and trouble swallowing.   Eyes: Negative for discharge and redness.  Respiratory: Positive for cough. Negative for chest tightness and shortness of breath.   Cardiovascular: Negative for chest pain.  Gastrointestinal: Positive for nausea. Negative for abdominal pain, diarrhea and vomiting.  Endocrine: Positive for polydipsia and polyuria.  Genitourinary: Positive for frequency. Negative for dysuria and urgency.  Musculoskeletal: Negative for myalgias.  Skin: Negative for rash.  Neurological: Negative for dizziness, weakness, light-headedness and headaches.     Physical Exam Triage Vital Signs ED Triage Vitals [04/30/18 1833]  Enc Vitals Group     BP 140/84     Pulse Rate 72     Resp 18     Temp 98.2 F (36.8 C)     Temp Source Oral     SpO2 100 %     Weight      Height      Head Circumference      Peak Flow      Pain Score      Pain Loc      Pain Edu?      Excl. in [place]?    No data found.  Updated Vital Signs BP 140/84 (BP Location: Left Arm)   Pulse 72   Temp 98.2 F (36.8 C) (Oral)   Resp 18   SpO2 100%   Visual Acuity Right Eye Distance:   Left Eye Distance:     Bilateral Distance:    Right Eye Near:   Left Eye Near:    Bilateral Near:     Physical Exam Vitals signs and nursing note reviewed.  Constitutional:      Appearance: He is well-developed.  HENT:     Head: Normocephalic and atraumatic.     Ears:     Comments: Bilateral ears without tenderness to palpation of external auricle, tragus and mastoid, EAC's without erythema or swelling, TM's with good bony landmarks and cone of light. Non erythematous.    Mouth/Throat:     Comments: Oral mucosa pink and moist, no  tonsillar enlargement or exudate. Posterior pharynx patent and nonerythematous, no uvula deviation or swelling. Normal phonation. Eyes:     Extraocular Movements: Extraocular movements intact.     Conjunctiva/sclera: Conjunctivae normal.     Pupils: Pupils are equal, round, and reactive to light.  Neck:     Musculoskeletal: Neck supple.  Cardiovascular:     Rate and Rhythm: Normal rate and regular rhythm.     Heart sounds: No murmur.  Pulmonary:     Effort: Pulmonary effort is normal. No respiratory distress.     Breath sounds: Normal breath sounds.     Comments: Breathing comfortably at rest, CTABL, no wheezing, rales or other adventitious sounds auscultated Abdominal:     Palpations: Abdomen is soft.     Tenderness: There is no abdominal tenderness.     Comments: Abdomen soft, nondistended, tender to epigastric region, other quadrants nontender, negative rebound, negative Burney's, negative Murphy's No CVA tenderness  Skin:    General: Skin is warm and dry.  Neurological:     Mental Status: He is alert.      UC Treatments / Results  Labs (all labs ordered are listed, but only abnormal results are displayed) Labs Reviewed  POCT URINALYSIS DIP (DEVICE) - Abnormal; Notable for the following components:      Result Value   Glucose, UA 500 (*)    Hgb urine dipstick TRACE (*)    All other components within normal limits  POCT I-STAT, CHEM 8 - Abnormal; Notable for  the following components:   Sodium 134 (*)    BUN 34 (*)    Creatinine, Ser 1.80 (*)    Glucose, Bld 587 (*)    All other components within normal limits  I-STAT CHEM 8, ED    EKG None  Radiology No results found.  Procedures Procedures (including critical care time)  Medications Ordered in UC Medications - No data to display  Initial Impression / Assessment and Plan / UC Course  I have reviewed the triage vital signs and the nursing notes.  Pertinent labs & imaging results that were available during my care of the patient were reviewed by me and considered in my medical decision making (see chart for details).    Discussed case with Dr. Valere Dross, who is agreeable with plan.  72 year old male with nausea and fatigue over the past week.  Glucose in clinic 587.  Other mild electrolyte abnormalities.  UA with greater than 500 glucose, negative leuks and nitrites.  EKG normal sinus rhythm with nonspecific T wave abnormalities, similar to previous EKG on record.  No acute signs of ischemia or infarction.  Lungs clear, no fever, UA without signs of infection.  Unclear cause of why his blood sugars spiked up of recently.  Does not appear to be in DKA right now.  At this time will recommend continued close blood sugar monitoring, oral rehydration, follow-up with PCP as soon as possible.  If symptoms worsening or blood sugar not trending downward with oral rehydration to go to emergency room for further treatment.  May need fluids and insulin if symptoms persist.  Patient and his wife verbalized understanding and importance of close follow-up.Discussed strict return precautions. Patient verbalized understanding and is agreeable with plan.  Final Clinical Impressions(s) / UC Diagnoses   Final diagnoses:  Nausea without vomiting  Elevated blood sugar     Discharge Instructions     Your EKG did not show anything alarming regarding your heart Please push plenty of fluids to  help decrease  blood sugar and help with any dehydration Contact your PCP on Thrusday when they repoen In the meantime over the next 2 days if you have worsening symptoms or blood sugar spiking higher to go to the emergency room   ED Prescriptions    Medication Sig Dispense Auth. Provider   ondansetron (ZOFRAN ODT) 4 MG disintegrating tablet Take 1 tablet (4 mg total) by mouth every 8 (eight) hours as needed for nausea or vomiting. 20 tablet Sharolyn Weber, [place] C, PA-C     Controlled Substance Prescriptions  Controlled Substance Registry consulted? Not Applicable   Janith Lima, [place] 05/03/18 1136

## 2018-05-16 DIAGNOSIS — R945 Abnormal results of liver function studies: Secondary | ICD-10-CM | POA: Diagnosis not present

## 2018-05-16 DIAGNOSIS — B1920 Unspecified viral hepatitis C without hepatic coma: Secondary | ICD-10-CM | POA: Diagnosis not present

## 2018-05-16 DIAGNOSIS — I10 Essential (primary) hypertension: Secondary | ICD-10-CM | POA: Diagnosis not present

## 2018-05-22 DIAGNOSIS — M25612 Stiffness of left shoulder, not elsewhere classified: Secondary | ICD-10-CM | POA: Diagnosis not present

## 2018-05-22 DIAGNOSIS — M75122 Complete rotator cuff tear or rupture of left shoulder, not specified as traumatic: Secondary | ICD-10-CM | POA: Diagnosis not present

## 2018-05-29 DIAGNOSIS — M25612 Stiffness of left shoulder, not elsewhere classified: Secondary | ICD-10-CM | POA: Diagnosis not present

## 2018-05-29 DIAGNOSIS — M75122 Complete rotator cuff tear or rupture of left shoulder, not specified as traumatic: Secondary | ICD-10-CM | POA: Diagnosis not present

## 2018-06-04 DIAGNOSIS — M25512 Pain in left shoulder: Secondary | ICD-10-CM | POA: Diagnosis not present

## 2018-06-05 DIAGNOSIS — M25612 Stiffness of left shoulder, not elsewhere classified: Secondary | ICD-10-CM | POA: Diagnosis not present

## 2018-06-05 DIAGNOSIS — M75122 Complete rotator cuff tear or rupture of left shoulder, not specified as traumatic: Secondary | ICD-10-CM | POA: Diagnosis not present

## 2018-06-12 DIAGNOSIS — M75122 Complete rotator cuff tear or rupture of left shoulder, not specified as traumatic: Secondary | ICD-10-CM | POA: Diagnosis not present

## 2018-06-12 DIAGNOSIS — M25612 Stiffness of left shoulder, not elsewhere classified: Secondary | ICD-10-CM | POA: Diagnosis not present

## 2018-07-02 DIAGNOSIS — M25612 Stiffness of left shoulder, not elsewhere classified: Secondary | ICD-10-CM | POA: Diagnosis not present

## 2018-07-16 DIAGNOSIS — I129 Hypertensive chronic kidney disease with stage 1 through stage 4 chronic kidney disease, or unspecified chronic kidney disease: Secondary | ICD-10-CM | POA: Diagnosis not present

## 2018-07-16 DIAGNOSIS — E782 Mixed hyperlipidemia: Secondary | ICD-10-CM | POA: Diagnosis not present

## 2018-07-16 DIAGNOSIS — E11319 Type 2 diabetes mellitus with unspecified diabetic retinopathy without macular edema: Secondary | ICD-10-CM | POA: Diagnosis not present

## 2018-07-16 DIAGNOSIS — D696 Thrombocytopenia, unspecified: Secondary | ICD-10-CM | POA: Diagnosis not present

## 2018-07-16 DIAGNOSIS — D649 Anemia, unspecified: Secondary | ICD-10-CM | POA: Diagnosis not present

## 2018-07-16 DIAGNOSIS — N183 Chronic kidney disease, stage 3 (moderate): Secondary | ICD-10-CM | POA: Diagnosis not present

## 2018-07-17 DIAGNOSIS — B1920 Unspecified viral hepatitis C without hepatic coma: Secondary | ICD-10-CM | POA: Diagnosis not present

## 2018-07-17 DIAGNOSIS — D649 Anemia, unspecified: Secondary | ICD-10-CM | POA: Diagnosis not present

## 2018-07-17 DIAGNOSIS — I129 Hypertensive chronic kidney disease with stage 1 through stage 4 chronic kidney disease, or unspecified chronic kidney disease: Secondary | ICD-10-CM | POA: Diagnosis not present

## 2018-07-17 DIAGNOSIS — E782 Mixed hyperlipidemia: Secondary | ICD-10-CM | POA: Diagnosis not present

## 2018-07-17 DIAGNOSIS — N183 Chronic kidney disease, stage 3 (moderate): Secondary | ICD-10-CM | POA: Diagnosis not present

## 2018-07-17 DIAGNOSIS — R251 Tremor, unspecified: Secondary | ICD-10-CM | POA: Diagnosis not present

## 2018-07-17 DIAGNOSIS — E11319 Type 2 diabetes mellitus with unspecified diabetic retinopathy without macular edema: Secondary | ICD-10-CM | POA: Diagnosis not present

## 2018-07-23 DIAGNOSIS — D649 Anemia, unspecified: Secondary | ICD-10-CM | POA: Diagnosis not present

## 2018-10-09 DIAGNOSIS — H52203 Unspecified astigmatism, bilateral: Secondary | ICD-10-CM | POA: Diagnosis not present

## 2018-10-09 DIAGNOSIS — H524 Presbyopia: Secondary | ICD-10-CM | POA: Diagnosis not present

## 2018-10-09 DIAGNOSIS — E119 Type 2 diabetes mellitus without complications: Secondary | ICD-10-CM | POA: Diagnosis not present

## 2018-10-09 DIAGNOSIS — Z961 Presence of intraocular lens: Secondary | ICD-10-CM | POA: Diagnosis not present

## 2018-10-09 DIAGNOSIS — H04123 Dry eye syndrome of bilateral lacrimal glands: Secondary | ICD-10-CM | POA: Diagnosis not present

## 2018-10-18 DIAGNOSIS — E11319 Type 2 diabetes mellitus with unspecified diabetic retinopathy without macular edema: Secondary | ICD-10-CM | POA: Diagnosis not present

## 2018-10-18 DIAGNOSIS — E782 Mixed hyperlipidemia: Secondary | ICD-10-CM | POA: Diagnosis not present

## 2018-10-18 DIAGNOSIS — I10 Essential (primary) hypertension: Secondary | ICD-10-CM | POA: Diagnosis not present

## 2018-10-18 DIAGNOSIS — D649 Anemia, unspecified: Secondary | ICD-10-CM | POA: Diagnosis not present

## 2018-10-23 DIAGNOSIS — E782 Mixed hyperlipidemia: Secondary | ICD-10-CM | POA: Diagnosis not present

## 2018-10-23 DIAGNOSIS — D649 Anemia, unspecified: Secondary | ICD-10-CM | POA: Diagnosis not present

## 2018-10-23 DIAGNOSIS — I129 Hypertensive chronic kidney disease with stage 1 through stage 4 chronic kidney disease, or unspecified chronic kidney disease: Secondary | ICD-10-CM | POA: Diagnosis not present

## 2018-10-23 DIAGNOSIS — H6983 Other specified disorders of Eustachian tube, bilateral: Secondary | ICD-10-CM | POA: Diagnosis not present

## 2018-10-23 DIAGNOSIS — B1920 Unspecified viral hepatitis C without hepatic coma: Secondary | ICD-10-CM | POA: Diagnosis not present

## 2018-10-23 DIAGNOSIS — E11319 Type 2 diabetes mellitus with unspecified diabetic retinopathy without macular edema: Secondary | ICD-10-CM | POA: Diagnosis not present

## 2018-10-23 DIAGNOSIS — Z7984 Long term (current) use of oral hypoglycemic drugs: Secondary | ICD-10-CM | POA: Diagnosis not present

## 2018-10-23 DIAGNOSIS — N183 Chronic kidney disease, stage 3 (moderate): Secondary | ICD-10-CM | POA: Diagnosis not present

## 2018-10-23 DIAGNOSIS — H9313 Tinnitus, bilateral: Secondary | ICD-10-CM | POA: Diagnosis not present

## 2018-12-07 DIAGNOSIS — R768 Other specified abnormal immunological findings in serum: Secondary | ICD-10-CM | POA: Diagnosis not present

## 2018-12-07 DIAGNOSIS — B1920 Unspecified viral hepatitis C without hepatic coma: Secondary | ICD-10-CM | POA: Diagnosis not present

## 2018-12-19 DIAGNOSIS — R768 Other specified abnormal immunological findings in serum: Secondary | ICD-10-CM | POA: Diagnosis not present

## 2018-12-31 DIAGNOSIS — Z23 Encounter for immunization: Secondary | ICD-10-CM | POA: Diagnosis not present

## 2019-01-31 DIAGNOSIS — Z23 Encounter for immunization: Secondary | ICD-10-CM | POA: Diagnosis not present

## 2019-02-06 DIAGNOSIS — B1920 Unspecified viral hepatitis C without hepatic coma: Secondary | ICD-10-CM | POA: Diagnosis not present

## 2019-02-06 DIAGNOSIS — K219 Gastro-esophageal reflux disease without esophagitis: Secondary | ICD-10-CM | POA: Diagnosis not present

## 2019-02-06 DIAGNOSIS — K7469 Other cirrhosis of liver: Secondary | ICD-10-CM | POA: Diagnosis not present

## 2019-02-06 DIAGNOSIS — K5900 Constipation, unspecified: Secondary | ICD-10-CM | POA: Diagnosis not present

## 2019-03-05 DIAGNOSIS — L233 Allergic contact dermatitis due to drugs in contact with skin: Secondary | ICD-10-CM | POA: Diagnosis not present

## 2019-04-01 DIAGNOSIS — B1920 Unspecified viral hepatitis C without hepatic coma: Secondary | ICD-10-CM | POA: Diagnosis not present

## 2019-04-01 DIAGNOSIS — K7469 Other cirrhosis of liver: Secondary | ICD-10-CM | POA: Diagnosis not present

## 2019-04-09 DIAGNOSIS — J4520 Mild intermittent asthma, uncomplicated: Secondary | ICD-10-CM | POA: Diagnosis not present

## 2019-04-09 DIAGNOSIS — Z0000 Encounter for general adult medical examination without abnormal findings: Secondary | ICD-10-CM | POA: Diagnosis not present

## 2019-04-09 DIAGNOSIS — G8929 Other chronic pain: Secondary | ICD-10-CM | POA: Diagnosis not present

## 2019-04-09 DIAGNOSIS — M545 Low back pain: Secondary | ICD-10-CM | POA: Diagnosis not present

## 2019-04-09 DIAGNOSIS — E11319 Type 2 diabetes mellitus with unspecified diabetic retinopathy without macular edema: Secondary | ICD-10-CM | POA: Diagnosis not present

## 2019-04-09 DIAGNOSIS — B1920 Unspecified viral hepatitis C without hepatic coma: Secondary | ICD-10-CM | POA: Diagnosis not present

## 2019-04-09 DIAGNOSIS — I10 Essential (primary) hypertension: Secondary | ICD-10-CM | POA: Diagnosis not present

## 2019-04-09 DIAGNOSIS — E782 Mixed hyperlipidemia: Secondary | ICD-10-CM | POA: Diagnosis not present

## 2019-04-09 DIAGNOSIS — K219 Gastro-esophageal reflux disease without esophagitis: Secondary | ICD-10-CM | POA: Diagnosis not present

## 2019-04-09 DIAGNOSIS — Z125 Encounter for screening for malignant neoplasm of prostate: Secondary | ICD-10-CM | POA: Diagnosis not present

## 2019-06-04 ENCOUNTER — Other Ambulatory Visit: Payer: Self-pay | Admitting: *Deleted

## 2019-06-04 NOTE — Patient Outreach (Signed)
  [facility]) Care Management Chronic Special Needs Program    06/04/2019  Name: Jeremiah Green, DOB: July 17, 1945  MRN: [id number]   [name] is enrolled in a chronic special needs plan for Diabetes. Attempted to reach [name] via home number to complete initial assessment; no answer; left HIPAA compliant message requesting return call.  Plan: If client does not return call, will make second outreach call within one week.  Cranford Mon RN, CCM, CDE Chronic Care Management Coordinator [facility] Care Management [phone]

## 2019-06-11 ENCOUNTER — Other Ambulatory Visit: Payer: Self-pay | Admitting: *Deleted

## 2019-06-11 ENCOUNTER — Ambulatory Visit: Payer: Self-pay | Admitting: *Deleted

## 2019-06-11 NOTE — Patient Outreach (Signed)
  [facility]) Care Management Chronic Special Needs Program    06/11/2019  Name: KAELAN AMBLE, DOB: 1945/10/23  MRN: [id number]   Mr. Aquilla Voiles is enrolled in a chronic special needs plan for Diabetes. Second attempt to reach Mr. Knueppel via home number to complete initial assessment; no answer; left HIPAA compliant message requesting return call.  Plan: If client does not return call, will make third outreach call within one week.  Cranford Mon RN, CCM, CDE Chronic Care Management Coordinator [facility] Care Management [phone]

## 2019-06-18 ENCOUNTER — Encounter: Payer: Self-pay | Admitting: *Deleted

## 2019-06-18 ENCOUNTER — Other Ambulatory Visit: Payer: Self-pay | Admitting: *Deleted

## 2019-06-18 DIAGNOSIS — E1122 Type 2 diabetes mellitus with diabetic chronic kidney disease: Secondary | ICD-10-CM

## 2019-06-18 DIAGNOSIS — K7460 Unspecified cirrhosis of liver: Secondary | ICD-10-CM

## 2019-06-18 DIAGNOSIS — N1832 Chronic kidney disease, stage 3b: Secondary | ICD-10-CM

## 2019-06-18 NOTE — Patient Outreach (Addendum)
[facility]) Care Management Chronic Special Needs Program  06/18/2019  Name: Jeremiah Green DOB: [birth year]  MRN: [id number]  Jeremiah Green is enrolled in a chronic special needs plan for Diabetes. Chronic Care Management Coordinator telephoned client to review health risk assessment and to develop individualized care plan.  Introduced the chronic care management program, importance of client participation, and taking their care plan to all provider appointments and inpatient facilities.  Reviewed the transition of care process and possible referral to community care management.  Subjective: Spoke with client and at client's request, his wife, to complete the initial assessment. [name] states he was diagnosed with Type 2 Diabetes about 10 years ago. He says he is tolerating Janumet well, takes it as prescribed but at times struggles to pay the copay when he is in the coverage gap. He states that he checks his fasting blood sugar 3 times weekly and reports the most recent readings have varied between 95-125. He denies hypoglycemia. He says he walks daily and does chair exercises. He did not know the meaning of Hgb  A1C, his A1C target or his A1C value. His wife says he does not eat much and client states he does not eat meat, eats lots of vegetables and drinks water most of the time. Client says he does occasionally drinks [place] but his wife says he drinks very little [employer]. He says he was told by his provider to drink more water because of his kidney disease.  He states the Advair keeps his asthma in good control and he uses his rescue inhaler very rarely. He was not able to identify asthma triggers.  He says he was taken off Spironolactone due to his kidney disease and does not monitor his blood pressure even though he has a blood pressure monitor at home.  He says he is concerned about his memory because he will walk in a room to get something and then forget what  he was going to get. His wife say she has noticed that he will ask her for directions to places he is very familiar with but has never become lost and he does not forget names of friends or families or lose things.  His wife and client deny food insecurity currently but state they have struggled in the past.   Client says he and his wife live in a one story home and lists his hobby as reading. Both client and wife state they will likely not get the Covid vaccine because they are skeptical of its effects and its long term side effects.  Goals Addressed            This Visit's Progress     Patient Stated   . "improve my memory" (pt-stated)       Assessed extent of memory deficits Discussed brain exercises like doing crosswords, word finding puzzles, reading,  Mailed Emmi education articles on Evaluating Memory and Thinking Problems and How to Help Your Memory to client's home address      Other   . Client understands the importance of follow-up with providers by attending scheduled visits   On track    Assessed client's adherence with keeping provider appointments and discussed his referral to nephrologist for his chronic kidney disease  Reviewed the completed providers appointments for 2020- total of 6 with his primary care provider    . Client will have food insecurities addressed if needed during 2021   On track  Reviewed the role of [facility] social workers in regards to assisting with food insecurity and reinforced the importance of notifying the [facility] if future needs arise so that referral can be made    . Client will report ability to obtain Medications throughout 2021       Referred to pharmacist for assistance for medication copay assistance during coverage gap    . Client will verbalize knowledge of self management of Hypertension as evidences by BP reading of 140/90 or less; or as defined by provider   On track    Client is aware of dx of Stage 3b kidney disease  and that he has been referred to a nephrologist Review of blood pressure readings in Care Everywhere indicate the majority of his blood pressures are meeting target Mailed Emmi education Diabetes and Blood Pressure to client's home address    . Client/Caregiver will verbalize understanding of instructions related to self-care and safety   On track    Client and wife report memory issues are minor and client is independent with ADLs and IADLs Mailed Emmi education articles on Evaluating Memory and Thinking Problems and How to Help Your Memory to client's home address    . HEMOGLOBIN A1C < 7.5       Discussed most recent Hgb A1C result of 7.2% on 04/09/19 Discussed A1C- definition, frequency, target Assessed client's understanding of his type of diabetes, reviewed basic pathophysiology of diabetes  Assessed client's competence and confidence in his ability to carry out glucose monitoring and interpret results to guide lifestyle choices and improve outcomes.(i.e. operational skills and interpretive skills).  Reviewed/Discussed blood sugar targets- pre and post meal Reviewed definitions of hypo/hyperglycemia, assessed frequency of hypoglycemia and reviewed treatment Discussed client's specific DM medications and reviewed basic mechanism of action, common side effects, dose and schedule Assessed patient's medication taking behavior Discussed the increased risks of macro and microvascular complications due to diabetes ( stroke, heart attack, neuropathy, retinopathy, nephropathy) Reviewed strategies to reducing risks of long term diabetes complications and counseled client on the importance of: provider follow up and how to prepare; yearly diabetic eye exam; dental checks every 6 months, annual diabetic foot exam; reviewed foot care and importance of checking  feet daily for skin impairment and problems that require provider notification    . Maintain timely refills of diabetic medication as prescribed  within the year .   On track    Assessed client's medication taking behavior Review of medication dispense report in the electronic medial record indicates client refills Janumet every 3 months    . Obtain annual  Lipid Profile, LDL-C   On track    Lipid profile was completed on 10/18/18; all elements met target    . Obtain Annual Eye (retinal)  Exam    On track    Diabetic eye exam was completed on 10/09/18    . Obtain Annual Foot Exam   On track    Diabetic foot exam was completed on 04/09/19    . Obtain annual screen for micro albuminuria (urine) , nephropathy (kidney problems)   On track    Urine for protein was completed on 04/09/19    . Obtain Hemoglobin A1C at least 2 times per year   On track    Hgb A1C was completed on 10/18/18 and 04/09/19    . Visit Primary Care Provider or Endocrinologist at least 2 times per year    On track    Per KPN (Knowledge Performance Now-  point of care tool) Client has completed 7 visits with his primary car provider since 04/03/18 He completed his annual wellness exam on 04/09/19      Assessment: Client is meeting diabetes self-management goal of hemoglobin A1C of <7.5% with most recent reading of  7.2 % on 04/09/19 without reports of hypoglycemia . Client has good understanding of:  COVID-19 cause, symptoms, precautions (social distancing, stay at home order, hand washing, wear face covering when unable to maintain or ensure 6 foot social distancing), and symptoms requiring provider notification.  Plan:   Send successful outreach letter with a copy of their individualized care plan to client Send individual care plan to provider Send educational material to client on blood pressure and diabetes and how to improve your memory Chronic care management coordination will outreach in:  3 Months Will refer client to:  Pharmacy   Kelli Churn RN, CCM, [place] Management [phone]

## 2019-06-19 ENCOUNTER — Telehealth: Payer: Self-pay | Admitting: Pharmacist

## 2019-06-19 NOTE — Patient Outreach (Signed)
Jeremiah Green [facility]) Care Management  [place]   06/19/2019  Jeremiah Green 1945-06-01 [id number]  Reason for referral: medication assistance  Referral source: [place] Referral medication(s): Advair Current insurance: HTA CSNP  PMHx:  Anxiety, Asthma, CKD stage 3, type 2 diabetes, GERD, hyperlipidemia and  Hypertension.  HPI:  Patient is a 74 year old male with the above mentioned medical conditions. Patient reported he does not get Advair filled often due to the cost.   Objective: Allergies  Allergen Reactions  . Losartan Rash    Itchy rash  . Polyethylene Glycol 3350 Rash  . Sulfa Antibiotics Rash    Medications Reviewed Today    Reviewed by Elayne Guerin, [facility] (Pharmacist) on 06/19/19 at 1428  Med List Status: <None>  Medication Order Taking? Sig Documenting Provider Last Dose Status Informant  albuterol (PROVENTIL HFA;VENTOLIN HFA) 108 (90 BASE) MCG/ACT inhaler [id number] Yes Inhale 2 puffs into the lungs every 6 (six) hours as needed for wheezing or shortness of breath. [provider] Taking Active Self  Blood Glucose Monitoring Suppl (ONE TOUCH ULTRA SYSTEM KIT) W/DEVICE KIT [id number] Yes 1 kit by Does not apply route once. [provider] Taking Active   CINNAMON PO [id number] Yes Take 1 tablet by mouth daily. [provider] Taking Active Self           Med Note Elayne Guerin   Wed Jun 19, 2019  2:26 PM)    fluticasone (FLONASE) 50 MCG/ACT nasal spray [id number] Yes Place 2 sprays into both nostrils daily. Copland, Gay Filler, MD Taking Active            Med Note Valetta Mole Jun 18, 2019  3:53 PM) Uses prn and very seldom  Fluticasone-Salmeterol (ADVAIR) 250-50 MCG/DOSE AEPB [id number] Yes Inhale 1 puff into the lungs daily. [provider] Taking Active Self  naphazoline-glycerin (CLEAR EYES) 0.012-0.2 % SOLN [id number] Yes Place 1 drop into both eyes daily. [provider] Taking  Active Self  Omega-3 1000 MG CAPS [id number] Yes Take 1 capsule by mouth daily. [provider] Taking Active   [facility] ULTRA test strip [id number] Yes Use to check blood sugar daily [provider] Taking Active   rosuvastatin (CRESTOR) 40 MG tablet [id number] Yes Take 1 tablet by mouth daily. [provider] Taking Active   sitaGLIPtin-metformin (JANUMET) 50-500 MG per tablet [id number] Yes Take 1 tablet by mouth 2 (two) times daily with a meal. Virgel Manifold, MD Taking Active Self        Discontinued 06/19/19 1428 (Completed Course)            Med Note Arnette Schaumann Jun 19, 2019  2:28 PM)            Assessment:  Drugs sorted by system:  Cardiovascular: Rosuvastatin  Pulmonary/Allergy: Albuterol HFA, Advair Diskus, Fluticasone Nasal Spray  Endocrine: Janumet  Vitamins/Minerals/Supplements: Cinnamon, Omega 3 Fatty Acids,   Miscellaneous:  Clear Eyes   Medication Review Findings:  . HgA1c-7.1% ([facility] 04/09/2019) . On rosuvastatin  . Checks blood sugar three times per week . Adherence-reported that he does not get Advair filled often due to cost  Medication Assistance Findings:  Medication assistance needs identified: Janumet, Proventil HFA and Dulera if his provider will agree to switch to [facility] due to cost     Additional medication assistance options reviewed with patient as warranted:  No other options identified  Plan: I will route  patient assistance letter to [place] technician who will coordinate patient assistance program application process for medications listed above.  [facility] pharmacy technician will assist with obtaining all required documents from both patient and provider(s) and submit application(s) once completed.    Will follow up with the patient in 6-8 weeks.  Elayne Guerin, PharmD, [place] Clinical Pharmacist [phone]

## 2019-06-20 ENCOUNTER — Other Ambulatory Visit: Payer: Self-pay | Admitting: Pharmacy Technician

## 2019-06-20 NOTE — Patient Outreach (Signed)
[facility]) Care Management  06/20/2019  Jeremiah Green 19-Mar-1946 [id number]                                        Medication Assistance Referral  Referral From: [facility] Katina B.  Medication/Company: Trisha Mangle, Proventil HFA / [employer] Patient application portion:  Mining engineer portion:  Mailed to [employer], PA-C Provider address/fax verified via: Office website    Follow up:  Will follow up with patient in 5-15 business days to confirm application(s) have been received.  Tiandra Swoveland P. Mert Dietrick, CPhT Mus[facility] Care Management [phone]

## 2019-07-05 ENCOUNTER — Other Ambulatory Visit: Payer: Self-pay | Admitting: Pharmacy Technician

## 2019-07-05 NOTE — Patient Outreach (Signed)
[facility]) Care Management  07/05/2019  Jeremiah Green [birth year] [id number]    Unsuccessful call placed to patient regarding patient assistance application(s) for Janumet, Dulera and Proventil HFA , HIPAA compliant voicemail left.   Was calling patient to inquire if he has received the application that was mailed to him on 06/20/2019.  Follow up:  Will follow up with 2nd outreach attempt in 5-10 business days if call is not returned.  Shelia Magallon P. Yuan Gann, CPhT [facility] [phone]

## 2019-07-11 ENCOUNTER — Other Ambulatory Visit: Payer: Self-pay | Admitting: Pharmacy Technician

## 2019-07-11 NOTE — Patient Outreach (Signed)
[facility]) Care Management  07/11/2019  Jeremiah Green 1945-12-07 [id number]   Unsuccessful call placed to patient regarding patient assistance application(s) for Trisha Mangle and Proventil HFA with [employer] , HIPAA identifiers verified.   Today was my 2nd unsuccessful call to patient to inquire if he has received the application that was mailed to him on 06/20/2019.  Follow up:  Will follow up with 3rd outreach attempt in 3-5 business days.  Evaleen Sant P. Dillyn Joaquin, CPhT [facility] [employer]  [phone]

## 2019-07-15 ENCOUNTER — Other Ambulatory Visit: Payer: Self-pay | Admitting: Pharmacy Technician

## 2019-07-15 NOTE — Patient Outreach (Signed)
[facility]) Care Management  07/15/2019  NYAIR DEPAULO 11/01/1945 [id number]    Incoming call received from patient's wife Bonita Quin in regards to patient's [employer] application Janumet, [place] and Proventil HFA.  Spoke to [place], HIPAA identifiers verified.  Bonita Quin was returning my call. She inquired as to why I was calling. Informed Bonita Quin that we had mailed the patient a [employer] application on 06/20/2019 and I was inquiring if the application had been received or if the patient had any questions on the application process. Patient's wife informed she was not sure if patient has received the application but would go through all the mail to see if the application can be located. Informed Bonita Quin what type of envelope to look for and what is contained inside the envelope. Informed her to make sure the patient signs it in both places and fills in the income amount. Patient's wife verbalized understanding. Patient informed she would outreach me back if the application can not be located.  Will route note to [facility] RPh Nunzio Cobbs if application is not received back in 15 business days.  Meredyth Hornung P. Esgar Barnick, CPhT [facility] [employer] [employer]  [phone]

## 2019-07-20 ENCOUNTER — Ambulatory Visit: Payer: Medicare Other | Attending: Internal Medicine

## 2019-07-20 DIAGNOSIS — Z23 Encounter for immunization: Secondary | ICD-10-CM

## 2019-07-20 NOTE — Progress Notes (Signed)
   Covid-19 Vaccination Clinic  Name:  LEVOY GEISEN    MRN: [id number] DOB: May 02, 1946  07/20/2019  Mr. Pedrosa was observed post Covid-19 immunization for 15 minutes without incident. He was provided with Vaccine Information Sheet and instruction to access the V-Safe system.   Mr. Dib was instructed to call 911 with any severe reactions post vaccine: [name] Kitchen Difficulty breathing  . Swelling of face and throat  . A fast heartbeat  . A bad rash all over body  . Dizziness and weakness   Immunizations Administered    Name Date Dose VIS Date Route   Pfizer COVID-19 Vaccine 07/20/2019  2:12 PM 0.3 mL 04/19/2019 Intramuscular   Manufacturer: [employer], [employer]   Lot: [id number]   NDC: [address]-4

## 2019-07-31 ENCOUNTER — Other Ambulatory Visit: Payer: Self-pay

## 2019-07-31 NOTE — Patient Outreach (Signed)
  [facility]) Care Management Chronic Special Needs Program    07/31/2019  Name: Jeremiah Green, DOB: 12/16/1945  MRN: [id number]    RNCM received notification that client is dis enrolled from [employer] C-SNP plan.  RNCM is no longer able to follow as clients C-SNP chronic care management coordinator.  HTA to notify client of dis enrollment from plan.  PLAN; Case closed RNCM will send case closure letter to primary MD.   George Ina RN,BSN,CCM Chronic Care Management Coordinator [facility] Care Management [phone]

## 2019-08-05 ENCOUNTER — Other Ambulatory Visit: Payer: Self-pay | Admitting: Pharmacist

## 2019-08-05 NOTE — Patient Outreach (Signed)
[facility]) Care Management  08/05/2019  UMBERTO PAVEK June 30, 1945 [id number]   [facility])  [facility] Quality Pharmacy Team    [facility] pharmacy case will be closed as our team is transitioning from the [facility] Care Management Department into the [employer] [facility] Quality Department and will no longer be using [facility] for documentation purposes.   [facility] pharmacy technician will continue to assist patient with medication assistance program applications until complete.      Beecher Mcardle, PharmD, BCACP [facility] Clinical Pharmacist [phone]

## 2019-08-06 ENCOUNTER — Ambulatory Visit: Payer: Self-pay | Admitting: Pharmacist

## 2019-08-12 NOTE — Telephone Encounter (Signed)
This encounter was created in error - please disregard.

## 2019-08-13 ENCOUNTER — Ambulatory Visit: Payer: Medicare Other | Attending: Internal Medicine

## 2019-08-13 DIAGNOSIS — Z23 Encounter for immunization: Secondary | ICD-10-CM

## 2019-08-13 NOTE — Progress Notes (Signed)
   Covid-19 Vaccination Clinic  Name:  Jeremiah Green    MRN: [id number] DOB: 11-Jul-1945  08/13/2019  [name] was observed post Covid-19 immunization for 15 minutes without incident. He was provided with Vaccine Information Sheet and instruction to access the V-Safe system.   [name] was instructed to call 911 with any severe reactions post vaccine: [name] Kitchen Difficulty breathing  . Swelling of face and throat  . A fast heartbeat  . A bad rash all over body  . Dizziness and weakness   Immunizations Administered    Name Date Dose VIS Date Route   Pfizer COVID-19 Vaccine 08/13/2019  2:07 PM 0.3 mL 04/19/2019 Intramuscular   Manufacturer: [employer], [employer]   Lot: [id number]   NDC: [address]-6

## 2019-09-03 ENCOUNTER — Ambulatory Visit: Payer: Self-pay

## 2019-09-10 ENCOUNTER — Other Ambulatory Visit: Payer: Self-pay

## 2019-09-10 NOTE — Patient Outreach (Signed)
  [facility]) Care Management Chronic Special Needs Program    09/10/2019  Name: Jeremiah Green, DOB: 28-Jan-1946  MRN: [id number]   Mr. Marvion Bastidas is enrolled in a chronic special needs plan for Diabetes. Telephone call to client for assessment follow up. Unable to reach. HIPAA compliant voice message left with call back phone number and return call request.   PLAN; RNCM will attempt 2nd telephone call to client within 1 week.   George Ina RN,BSN,CCM Chronic Care Management Coordinator [facility] Care Management [phone]

## 2019-09-19 ENCOUNTER — Ambulatory Visit: Payer: Self-pay

## 2019-09-20 ENCOUNTER — Ambulatory Visit: Payer: HMO

## 2019-09-24 ENCOUNTER — Other Ambulatory Visit: Payer: Self-pay

## 2019-09-24 NOTE — Patient Outreach (Signed)
[facility]) Care Management Chronic Special Needs Program  09/24/2019  Name: Jeremiah Green DOB: 26-Oct-1945  MRN: [id number]  [name] is enrolled in a chronic special needs plan for Diabetes. Telephone call to client for assessment follow up. HIPAA verified. Client reports his blood sugars are ranging from 140's to 400. Client reports seeing his primary MD on 09/03/19 for follow up. Client states he is making changes with his diet by eating less meat and dairy and eating more vegetables /fruits. He states he has a hard time figuring out what he can eat with his diabetes.  Client reports his blood pressure readings have been good with today's reading being  117/74.  Client states he is able to obtain food at this time and declined social work assistance for resources. Client states he feels like his memory is doing fine.   Client reports completing his COVID vaccine series. RNCM discussed and offered HTA health coach follow up for diabetes diet education. Client verbally agreed.   Reviewed and updated care plan.    Goals Addressed            This Visit's Progress   . "improve my memory" (pt-stated)   On track    Re Assessed extent of memory deficits Reinforced with client ways to help memory, ie. Memory games, crossword puzzles, sudoku, reading   Per HTA Update: Client still active with HTA. There has been no break in coverage.        . Client understands the importance of follow-up with providers by attending scheduled visits   On track    Last primary care provider visit 09/03/19, 04/09/19  Continue to maintain and keep follow up visits with your providers  Per HTA Update: Client still active with HTA. There has been no break in coverage.    . Client will have food insecurities addressed if needed during 2021   On track    RNCM re dicussed role of social worker if needed for community resources/ food needs.   Per HTA Update: Client still active with  HTA. There has been no break in coverage.    . Client will report abillity to obtain Medications throughout 2021   On track    RN sent follow up request to pharmacy tech regarding medication assistance for client.   Per HTA Update: Client still active with HTA. There has been no break in coverage.     . Client will verbalize knowledge of self management of Hypertension as evidences by BP reading of 140/90 or less; or as defined by provider   On track    Most recent blood pressure at primary MD visit on 09/03/19 was 136/80 Continue to check your blood pressure as recommended by your doctor Continue to take your medications as prescribed.   Per HTA Update: Client still active with HTA. There has been no break in coverage.      . COMPLETED: Client/Caregiver will verbalize understanding of instructions related to self-care and safety       Client reports  memory issues are minor and he  continues to be independent in his care.   Goal met  Per HTA Update: Client still active with HTA. There has been no break in coverage.      [name] Kitchen HEMOGLOBIN A1C < 7.5       Discussed most recent Hgb A1C result of 7.2% on 04/09/19 Client reports needing assistance with understanding the right foods to eat for diabetes management.  RN case manager send client education article: Diabetes diet, Diabetic meal planning RN case manager referred client to [employer] health coach.   Per HTA Update: Client still active with HTA. There has been no break in coverage.    . Maintain timely refills of diabetic medication as prescribed within the year .   On track    Continue to take your medications as prescribed.  Review of medical records indicate client maintains timely refills of diabetic medications Follow up with your doctor if you have questions about your medications  Contact your assigned RN case manager if you have difficulty obtaining your medications    Per HTA Update: Client still active with  HTA. There has been no break in coverage.    . Obtain annual  Lipid Profile, LDL-C   On track    Lipid profile was completed on 10/18/18; The goal for LDL is less than 70 mg/ dl as you are at high risk for complications try to avoid saturated fats, trans-fats, and eat more fiber. RN case manager will send client education article: Heart healthy diet  Per HTA Update: Client still active with HTA. There has been no break in coverage.    . Obtain Annual Eye (retinal)  Exam    On track    Diabetic eye exam was completed on 10/09/18 Client reports next follow up eye exam is scheduled for 10/14/19  Per HTA Update: Client still active with HTA. There has been no break in coverage.     . Obtain Annual Foot Exam   On track    Diabetic foot exam was completed on 04/09/19 Diabetes foot care - Check feet daily at home (look for skin color changes, cuts, sores or cracks in the skin, swelling of feet or ankles, ingrown or fungal toenails, corn or calluses). Report these findings to your doctor - Wash feet with soap and water, dry feet well especially between toes - Moisturize your feet but not between the toes - Always wear shoes that protect your whole feet.    Per HTA Update: Client still active with HTA. There has been no break in coverage.      . Obtain annual screen for micro albuminuria (urine) , nephropathy (kidney problems)   On track    Urine for protein was completed on 04/09/19 Continue to obtain yearly physicals and lab checks as recommended by provider.   Per HTA Update: Client still active with HTA. There has been no break in coverage.    Illa Level Hemoglobin A1C at least 2 times per year   On track    Hgb A1C was completed on and 04/09/19 Continue to keep your follow up appointments with your provider and have lab work completed as recommended.   Per HTA Update: Client still active with HTA. There has been no break in coverage.    . Understand what I can eat on a diabetic diet        RN case manager sent client education articles: Diabetic diet and Diabetic meal planning RNCM referred client to [employer] health coach     . Visit Primary Care Provider or Endocrinologist at least 2 times per year    On track    Last primary care provider visit 04/09/19 and 09/03/19 Continue to schedule a yearly physical and follow up visit as recommended by your provider.   Per HTA Update: Client still active with HTA. There has been no break in coverage.  Plan:  Send successful outreach letter with a copy of their individualized care plan, Send individual care plan to provider and Send educational material  Chronic care management coordinator will outreach in:  3 Months RNCM will refer client to health coach   Quinn Plowman RN,BSN,CCM [place] Management [phone]    .

## 2019-12-17 ENCOUNTER — Other Ambulatory Visit: Payer: Self-pay

## 2019-12-17 NOTE — Patient Outreach (Signed)
  [facility]) Care Management Chronic Special Needs Program    12/17/2019  Name: Jeremiah Green, DOB: [birth year]  MRN: [id number]   Mr. Jamorian Dimaria is enrolled in a chronic special needs plan for Diabetes. Telephone call to client for CSNP assessment follow up. Unable to reach.  HIPAA compliant voice message left with call back phone number.   PLAN; RNCM will attempt 2nd telephone outreach to client in 2 weeks.   George Ina RN,BSN,CCM Chronic Care Management Coordinator [facility] Care Management [phone]

## 2019-12-18 ENCOUNTER — Other Ambulatory Visit: Payer: Self-pay

## 2019-12-18 NOTE — Patient Outreach (Signed)
  [facility]) Care Management Chronic Special Needs Program    12/18/2019  Name: Jeremiah Green, DOB: 03-Jan-1946  MRN: [id number]   [name] is enrolled in a chronic special needs plan for Diabetes. Second telephone call to client for CSNP assessment follow up.  HIPAA verified. Client states he has another call and request call back at another time.  PLAN;  RNCM will attempt 3rd telephone outreach to client in 1 week.   George Ina RN,BSN,CCM Chronic Care Management Coordinator [facility] Care Management [phone]

## 2019-12-19 ENCOUNTER — Other Ambulatory Visit: Payer: Self-pay

## 2019-12-19 NOTE — Patient Outreach (Signed)
[facility]) Care Management Chronic Special Needs Program  12/19/2019  Name: Jeremiah Green DOB: [birth year]  MRN: [id number]  [name] is enrolled in a chronic special needs plan for Diabetes. Telephone call to client for CSNP assessment follow up.  Unable to reach. HIPAA compliant voice message left with call back phone number and return call request.  Individualized care plan reviewed and updated based on available data.    Goals Addressed              This Visit's Progress     "improve my memory" (pt-stated)   On track     RN case manager will send client education article: How to help you memory           Client understands the importance of follow-up with providers by attending scheduled visits   On track     Last primary care provider visit 4/27/2 and 12/16/19 Continue to maintain and keep follow up visits with your prov      Client will have food insecurities addressed if needed during 2021   On track     Contact your RN case manager  [phone]  f food assistance is needed.         Client will report abillity to obtain Medications throughout 2021   On track     Contact your RN case manager if you need assistance with obtaining your  medicine. Contact your doctor if you have questions       Client will verbalize knowledge of self management of Hypertension as evidences by BP reading of 140/90 or less; or as defined by provider   On track     RN case manager will send client education article: High blood pressure in adults  Continue to take your medications as prescribed        HEMOGLOBIN A1C < 7.5        Diabetes self management actions:  Glucose monitoring per provider recommendation  Check feet daily  Visit provider every 3-6 months as directed  Hbg A1C level every 3-6 months.  Eye Exam yearly  Carbohydrate controlled meal planning  Taking diabetes medication as prescribed by provider  Physical activity          Maintain timely refills of diabetic medication as prescribed within the year .   On track     Review of medical records indicate client maintains timely refills of diabetic medications Follow up with your doctor if you have questions about your medications  Contact your assigned RN case manager if you have difficulty obtaining your medications          COMPLETED: Obtain annual  Lipid Profile, LDL-C   On track     Lipid profile was completed on 08/31/19       Obtain Annual Eye (retinal)  Exam    On track     Diabetic eye exam was completed on 10/14/19        Obtain Annual Foot Exam   On track     Diabetic foot exam was completed on 04/09/19    DiabeteDiabetes can affect the nerves in your feet, causing decreased feeling or numbness.  Check your feet and in-between toes daily for cuts, bruises, redness, blisters or sores. If you can't reach them, use a mirror.   Wash feet with soap and water, dry feet well especially between toes. Don't use too much lotion.  Obtain annual screen for micro albuminuria (urine) , nephropathy (kidney problems)   On track     Urine for protein was completed on 04/09/19  Diabetes can affect your kidneys.   It is important for your doctor to check your urine at least once a year. These tests show how your kidneys are working.      COMPLETED: Obtain Hemoglobin A1C at least 2 times per year   On track     Hgb A1C was completed on and 8/9/21and 08/31/19       Understand what I can eat on a diabetic diet   On track     RN case manager will send client education article: Diabetic meal planning and guide to eating when you have diabetes.       Visit Primary Care Provider or Endocrinologist at least 2 times per year    On track     Last primary care provider visit 12/16/19 and  09/03/19 Continue to schedule a yearly physical and follow up visit as recommended by your provider.            Plan:  Send successful outreach letter  with a copy of their individualized care plan, Send individual care plan to provider and Send educational material  Chronic care management coordinator will outreach in:  3 Months   George Ina RN,BSN,CCM Chronic Care Management Coordinator [facility] Care Management [phone]     .

## 2020-02-11 ENCOUNTER — Other Ambulatory Visit: Payer: Self-pay

## 2020-02-11 NOTE — Patient Outreach (Addendum)
[facility]) Care Management Chronic Special Needs Program  02/11/2020  Name: Jeremiah Green DOB: Jun 27, 1945  MRN: [id number]  [name] is enrolled in a chronic special needs plan for DiabetesTelephone call to client for CSNP assessment follow up. HIPAA verified. Client reports having mild swelling in his feet and ankles on occasion. Client states he has reported this to his doctor. Client reports consistent follow up with his primary provider. He states he is having difficulty affording his Janumet.  Client reports mild depression symptoms. Denies taking medication for depression or having follow up with counseling. RNCM discussed and offered follow up with Child psychotherapist. Client agreed. Client reports fasting blood sugar of 121 today. Denies having frequent low blood sugar episodes. Client states he checks his blood sugar every 3 days or so due to cost of strips. RNCM advised client to contact his [employer] team [employer] concierge regarding coverage for glucose strips. Client verbalized understanding.  Goals Addressed              This Visit's Progress   .  "improve my memory" (pt-stated)   On track     Ways to improve memory Maintain a healthy weight Get enough sleep Train you brain with puzzles, reading,  Include physical activity in your daily routine Socialize regularly       .  COMPLETED: Client understands the importance of follow-up with providers by attending scheduled visits   On track     Last primary care provider visit 09/03/19 and 12/16/19 Ophthalmology visit 10/14/19     .  COMPLETED: Client will have food insecurities addressed if needed during 2021   On track     Client reports ability to obtain food. RNCM suggested food shopping at [employer] such as [employer], [employer], [employer] and farmers market for fresh produce.       .  Client will report abillity to obtain Medications throughout 2021   On track     RNCM will refer client to pharmacist  for medication assistance with Janumet      .  Client will report improved coping in 6 months        RN case manager will send client education article: Helping you cope RN case manager will refer client to social worker RN case manager allowed client opportunity to discuss feelings/ concerns.     .  Client will report obtaining blood glucose strips in 6 months.        RN case manager advised client to contact his [employer] concierge for coverage questions regarding his glucose monitoring strips.     .  COMPLETED: Client will verbalize knowledge of self management of Hypertension as evidences by BP reading of 140/90 or less; or as defined by provider   On track     Client reports home blood pressure readings: 130/75, 128/76 Client reports monitoring and recording blood pressure weekly.        .  Decrease in swelling in feet and ankles (pt-stated)        Elevate legs when swelling Follow a low salt diet. Exercise as tolerated and as recommended by your doctor.  RN case manager will send client education article: Swelling    .  HEMOGLOBIN A1C < 7.5        Congratulations your Hgb A1c is below goal at 6.8, completed on 12/16/19. Continue diabetes self management actions:  Glucose monitoring per provider recommendation  Check feet daily  Visit provider every 3-6  months as directed  Hbg A1C level every 3-6 months.  Eye Exam yearly  Carbohydrate controlled meal planning  Taking diabetes medication as prescribed by provider  Physical activity      .  Maintain timely refills of diabetic medication as prescribed within the year .   On track     RN case manager will refer client to pharmacist for medication assistance with Janumet.         .  COMPLETED: Obtain Annual Eye (retinal)  Exam         Diabetic eye exam was completed on 10/14/19      .  Obtain Annual Foot Exam   On track     Diabetic foot exam was completed on 04/09/19  It is important that your  doctor check you feet regularly.   Continue to follow diabetic foot care: [name] Kitchen DiabeteDiabetes can affect the nerves in your feet, causing decreased feeling or numbness. . Check your feet and in-between toes daily for cuts, bruises, redness, blisters or sores. If you can't reach them, use a mirror.  . Wash feet with soap and water, dry feet well especially between toes. Don't use too much lotion.          .  Obtain annual screen for micro albuminuria (urine) , nephropathy (kidney problems)   On track     Urine for protein was completed on 04/09/19 RN case manager will send client education article: Urine albumin It is important that your doctor check you urine at least once a year.  These tests show how your kidneys are working.      .  COMPLETED: Understand what I can eat on a diabetic diet   On track     Client states he understands his diabetic diet plan.      .  COMPLETED: Visit Primary Care Provider or Endocrinologist at least 2 times per year    On track     Last primary care provider visit 12/16/19 and  09/03/19           ASSESSMENT; Client CSNP tier level changed to level 2.  Client has not had any ED or hospital admissions within the past 6 months. Client's Hgb A1c currently at goal 6.8.   Plan:  Send successful outreach letter with a copy of their individualized care plan, Send individual care plan to provider and Send educational material  Chronic care management coordinator will outreach in:  6 Months  Will refer to:  Social Work and Pharmacy   George Ina RN,BSN,CCM Chronic Care Management Coordinator [facility] Care Management [phone]    .

## 2020-03-11 ENCOUNTER — Ambulatory Visit: Payer: Self-pay

## 2020-03-12 ENCOUNTER — Other Ambulatory Visit: Payer: Self-pay

## 2020-03-12 NOTE — Patient Outreach (Signed)
  [facility]) Care Management Chronic Special Needs Program    03/12/2020  Name: Jeremiah Green, Jeremiah Green: 1945-12-15  MRN: [id number]   [employer] care management team has assumed care and services for this member.  Case Closed by [facility] care management   George Ina RN,BSN,CCM Chronic Care Management Coordinator [facility] Care Management [phone]

## 2020-04-25 ENCOUNTER — Ambulatory Visit: Payer: HMO | Attending: Internal Medicine

## 2020-04-25 DIAGNOSIS — Z23 Encounter for immunization: Secondary | ICD-10-CM

## 2020-04-25 NOTE — Progress Notes (Signed)
   Covid-19 Vaccination Clinic  Name:  Jeremiah Green    MRN: [id number] DOB: February 08, 1946  04/25/2020  Jeremiah Green was observed post Covid-19 immunization for 15 minutes without incident. He was provided with Vaccine Information Sheet and instruction to access the V-Safe system.   Jeremiah Green was instructed to call 911 with any severe reactions post vaccine: [name] Kitchen Difficulty breathing  . Swelling of face and throat  . A fast heartbeat  . A bad rash all over body  . Dizziness and weakness   Immunizations Administered    Name Date Dose VIS Date Route   Pfizer COVID-19 Vaccine 04/25/2020 11:37 AM 0.3 mL 02/26/2020 Intramuscular   Manufacturer: [employer], [employer]   Lot: [id number]   NDC: [address]-8

## 2020-08-07 ENCOUNTER — Ambulatory Visit: Payer: Self-pay

## 2024-03-26 ENCOUNTER — Ambulatory Visit: Admitting: Adult Health

## 2024-03-26 NOTE — Progress Notes (Deleted)
 Patient presents to clinic today to establish care.  Acute Concerns:   Chronic Issues: HTN - managed with Norvasc 5 mg daily  DM - currently managed with Lantus 30 units daily, Humalog 7 at breakfast and lunch and 10 units at dinner, Januvia  50 mg daily and Metformin  500 mg XR daily. He is managed by Endocrinology. His most recent A1c was 8 on 01/16/2024  Diabetic Neuropathy - managed with Gabapentin 100 mg BID.   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 He denies myalgia or fatigue   Health Maintenance: Dental -- Vision -- Immunizations -- Colonoscopy -- aged out.     Past Medical History:  Diagnosis Date   Allergy    Arthritis    Asthma    Cataract    Crush injury, limb, upper    years ago, surgical repair   GERD (gastroesophageal reflux disease)    History of bladder stone    History of glaucoma    BILATERAL --   S/P LASER REPAIR 2013   Hyperlipidemia    Hypertension    Left ureteral calculus    Seizures (HCC)    10 years ago   Type 2 diabetes mellitus (HCC)    Ureteral stenosis, left     Past Surgical History:  Procedure Laterality Date   CYSTOSCOPY W/ URETERAL STENT PLACEMENT Left 02/06/2013   Procedure: CYSTOSCOPY WITH STENT REPLACEMENT;  Surgeon: Toribio Neysa Repine, MD;  Location: [facility];  Service: Urology;  Laterality: Left;   CYSTOSCOPY WITH RETROGRADE PYELOGRAM, URETEROSCOPY AND STENT PLACEMENT Left 01/21/2013   Procedure: CYSTOSCOPY WITH URETEROSCOPY AND STENT PLACEMENT;  Surgeon: Toribio Neysa Repine, MD;  Location: [facility];  Service: Urology;  Laterality: Left;   CYSTOSCOPY WITH RETROGRADE PYELOGRAM, URETEROSCOPY AND STENT PLACEMENT Left 02/06/2013   Procedure: CYSTOSCOPY , URETEROSCOPY  ;  Surgeon: Toribio Neysa Repine, MD;  Location: [facility];  Service: Urology;  Laterality: Left;   EXTRACORPOREAL SHOCK WAVE LITHOTRIPSY Left 12-10-2012    EYE SURGERY     GLAUCOMA SURGERY Bilateral 2013   LASER   HOLMIUM LASER APPLICATION Left 02/06/2013   Procedure: HOLMIUM LASER APPLICATION;  Surgeon: Toribio Neysa Repine, MD;  Location: [facility];  Service: Urology;  Laterality: Left;   TRANSURETHRAL RESECTION OF PROSTATE  2012   AND BLADDER STONE EXTRACTION    Current Outpatient Medications on File Prior to Visit  Medication Sig Dispense Refill   acetaminophen  (TYLENOL ) 325 MG tablet Take 325 mg by mouth every 6 (six) hours as needed. Client states he takes 1 tablet as needed for back pain     albuterol  (PROVENTIL  HFA;VENTOLIN  HFA) 108 (90 BASE) MCG/ACT inhaler Inhale 2 puffs into the lungs every 6 (six) hours as needed for wheezing or shortness of breath.     amLODipine (NORVASC) 5 MG tablet Take 5 mg by mouth daily.     Blood Glucose Monitoring Suppl (ONE TOUCH ULTRA SYSTEM KIT) W/DEVICE KIT 1 kit by Does not apply route once.     CINNAMON PO Take 1 tablet by mouth daily.     fluticasone  (FLONASE ) 50 MCG/ACT nasal spray Place 2 sprays into both nostrils daily. 16 g 6   Fluticasone -Salmeterol (ADVAIR) 250-50 MCG/DOSE AEPB Inhale 1 puff into the lungs daily.     Multiple Vitamin (MULTIVITAMIN) tablet Take 1 tablet by mouth daily.     naphazoline-glycerin (CLEAR EYES) 0.012-0.2 % SOLN  Place 1 drop into both eyes daily.     Omega-3 1000 MG CAPS Take 1 capsule by mouth daily.     ONETOUCH ULTRA test strip Use to check blood sugar daily     rosuvastatin (CRESTOR) 40 MG tablet Take 1 tablet by mouth daily.     sitaGLIPtin -metformin  (JANUMET ) 50-500 MG per tablet Take 1 tablet by mouth 2 (two) times daily with a meal. 60 tablet 0   No current facility-administered medications on file prior to visit.    Allergies  Allergen Reactions   Losartan Rash    Itchy rash   Polyethylene Glycol 3350 Rash   Sulfa Antibiotics Rash    Family History  Problem Relation Age of Onset   Cancer Mother    Heart disease Father     Diabetes Sister    Diabetes Brother    Diabetes Brother     Social History   Socioeconomic History   Marital status: Married    Spouse name: Not on file   Number of children: Not on file   Years of education: Not on file   Highest education level: Not on file  Occupational History   Occupation: Retired from data processing manager work  Tobacco Use   Smoking status: Former    Types: Cigarettes   Smokeless tobacco: Never  Vaping Use   Vaping status: Never Used  Substance and Sexual Activity   Alcohol use: No   Drug use: No   Sexual activity: Not on file  Other Topics Concern   Not on file  Social History Narrative   Lives with wife in one story home.   He is retired from data processing manager work.    Wife works in theatre stage manager at a air cabin [employer].   Social Drivers of Corporate Investment Banker Strain: Low Risk  (06/18/2019)   Overall Financial Resource Strain (CARDIA)    Difficulty of Paying Living Expenses: Not very hard  Food Insecurity: Low Risk  (09/15/2023)   Received from [facility]   Hunger Vital Sign    Within the past 12 months, you worried that your food would run out before you got money to buy more: Never true    Within the past 12 months, the food you bought just didn't last and you didn't have money to get more. : Never true  Transportation Needs: No Transportation Needs (09/15/2023)   Received from [facility]    In the past 12 months, has lack of reliable transportation kept you from medical appointments, meetings, work or from getting things needed for daily living? : No  Physical Activity: Sufficiently Active (06/18/2019)   Exercise Vital Sign    Days of Exercise per Week: 5 days    Minutes of Exercise per Session: 30 min  Stress: No Stress Concern Present (06/18/2019)   [employer] of Occupational Health - Occupational Stress Questionnaire    Feeling of Stress : Only a little  Social Connections: Unknown (06/18/2019)   Social Connection and  Isolation Panel    Frequency of Communication with Friends and Family: More than three times a week    Frequency of Social Gatherings with Friends and Family: Not on file    Attends Religious Services: More than 4 times per year    Active Member of [employer] or Organizations: Not on file    Attends Banker Meetings: Not on file    Marital Status: Married  Intimate Partner Violence: Not At Risk (06/18/2019)   Humiliation,  Afraid, Rape, and Kick questionnaire    Fear of Current or Ex-Partner: No    Emotionally Abused: No    Physically Abused: No    Sexually Abused: No    ROS  There were no vitals taken for this visit.  Physical Exam  No results found for this or any previous visit (from the past 2160 hours).  Assessment/Plan: No problem-specific Assessment & Plan notes found for this encounter.
